# Patient Record
Sex: Male | Born: 1945 | Race: White | Hispanic: No | Marital: Married | Smoking: Never smoker
Health system: Southern US, Community
[De-identification: ages and names within clinical notes are randomized; demographics above are authoritative.]

## PROBLEM LIST (undated history)

## (undated) DIAGNOSIS — K219 Gastro-esophageal reflux disease without esophagitis: Secondary | ICD-10-CM

## (undated) DIAGNOSIS — I4891 Unspecified atrial fibrillation: Secondary | ICD-10-CM

## (undated) DIAGNOSIS — M545 Low back pain, unspecified: Secondary | ICD-10-CM

## (undated) HISTORY — PX: VASECTOMY: SHX75

## (undated) HISTORY — DX: Low back pain, unspecified: M54.50

## (undated) HISTORY — PX: ROTATOR CUFF REPAIR: SHX139

## (undated) HISTORY — PX: TONSILLECTOMY: SUR1361

## (undated) HISTORY — DX: Low back pain: M54.5

## (undated) HISTORY — PX: LUMBAR LAMINECTOMY: SHX95

## (undated) HISTORY — DX: Unspecified atrial fibrillation: I48.91

---

## 1979-06-14 HISTORY — PX: VASECTOMY: SHX75

## 1989-06-13 HISTORY — PX: LUMBAR LAMINECTOMY: SHX95

## 2001-03-06 ENCOUNTER — Ambulatory Visit (HOSPITAL_COMMUNITY): Admission: RE | Admit: 2001-03-06 | Discharge: 2001-03-06 | Payer: Self-pay | Admitting: Family Medicine

## 2001-03-13 ENCOUNTER — Encounter: Admission: RE | Admit: 2001-03-13 | Discharge: 2001-03-13 | Payer: Self-pay | Admitting: Sports Medicine

## 2001-04-03 ENCOUNTER — Encounter: Admission: RE | Admit: 2001-04-03 | Discharge: 2001-04-03 | Payer: Self-pay | Admitting: Sports Medicine

## 2003-12-24 ENCOUNTER — Ambulatory Visit (HOSPITAL_COMMUNITY): Admission: RE | Admit: 2003-12-24 | Discharge: 2003-12-24 | Payer: Self-pay | Admitting: Gastroenterology

## 2003-12-24 ENCOUNTER — Encounter (INDEPENDENT_AMBULATORY_CARE_PROVIDER_SITE_OTHER): Payer: Self-pay | Admitting: Specialist

## 2008-07-16 ENCOUNTER — Ambulatory Visit: Payer: Self-pay | Admitting: Sports Medicine

## 2008-07-16 DIAGNOSIS — S838X9A Sprain of other specified parts of unspecified knee, initial encounter: Secondary | ICD-10-CM

## 2008-07-16 DIAGNOSIS — S86819A Strain of other muscle(s) and tendon(s) at lower leg level, unspecified leg, initial encounter: Secondary | ICD-10-CM

## 2008-07-16 DIAGNOSIS — M79609 Pain in unspecified limb: Secondary | ICD-10-CM

## 2008-07-23 ENCOUNTER — Ambulatory Visit: Payer: Self-pay | Admitting: Sports Medicine

## 2008-08-07 ENCOUNTER — Ambulatory Visit: Payer: Self-pay | Admitting: Sports Medicine

## 2008-09-04 ENCOUNTER — Ambulatory Visit: Payer: Self-pay | Admitting: Sports Medicine

## 2010-10-29 NOTE — Op Note (Signed)
Andre Parker, Andre Parker                             ACCOUNT NO.:  [id number]   MEDICAL RECORD NO.:  [id number]                   PATIENT TYPE:  AMB   LOCATION:  ENDO                                 FACILITY:  [facility]   PHYSICIAN:  Bernette Redbird, M.D.                DATE OF BIRTH:  [birth year]   DATE OF PROCEDURE:  12/24/2003  DATE OF DISCHARGE:                                 OPERATIVE REPORT   PROCEDURE:  Colonoscopy with polypectomy and biopsies.   INDICATION:  Screening for colon cancer in an asymptomatic 65 year old with  a possible family history of colon polyps in his father.   FINDINGS:  Four small polyps removed.   PROCEDURE:  The nature, purpose and risks of the procedure have been  discussed with patient who provided written consent.  Sedation was fentanyl  62.5 mcg and Versed 6 mg IV without an clinical instability.  Digital exam  of the prostate was normal.  The Olympus adult adjustable tension video  colonoscope was advanced easily to the cecum and for a short distance into a  normal-appearing terminal ileum, whereupon pullback was initiated.  The  quality of the prep was excellent and it was felt that all areas were well  seen.   At the level of the ileocecal valve, contralateral, was a semi-pedunculated  bilobed sessile polyp measuring approximately 4 x 5 mm or so, removed by  cold snare technique and then the base was touched up with a little bit of  cold biopsies.   In the proximal ascending colon were two small sessile polyps removed by  cold biopsy technique, and at 17 cm in the proximal rectum was a diminutive  sessile polyp again removed by cold biopsy.   No large polyps, cancer, colitis, vascular malformations or diverticulosis  were noted.  Retroflexion of the rectum and reinspection of the rectum was  otherwise unremarkable.  The patient tolerated the procedure well and there  were no apparent complications.   IMPRESSION:  Colon polyps removed as described  above.   PLAN:  1. Await pathology results.  2. Probable colonoscopic followup in 3-5 years depending on the histology     and keeping in mind the possible family history of colon polyps in his     father.                                               Bernette Redbird, M.D.    RB/MEDQ  D:  12/24/2003  T:  12/24/2003  Job:  [id number]   cc:   Marjory Lies, M.D.  [address]  [place]  [place] [address]  Fax: [phone]

## 2011-04-26 ENCOUNTER — Other Ambulatory Visit: Payer: Self-pay | Admitting: Gastroenterology

## 2011-10-11 ENCOUNTER — Other Ambulatory Visit: Payer: Self-pay | Admitting: Family Medicine

## 2011-10-11 DIAGNOSIS — M949 Disorder of cartilage, unspecified: Secondary | ICD-10-CM

## 2011-10-11 DIAGNOSIS — M899 Disorder of bone, unspecified: Secondary | ICD-10-CM

## 2011-10-17 ENCOUNTER — Ambulatory Visit
Admission: RE | Admit: 2011-10-17 | Discharge: 2011-10-17 | Disposition: A | Discharge status: Home / Self Care | Payer: Medicare Other | Source: Ambulatory Visit | Attending: Family Medicine | Admitting: Family Medicine

## 2011-10-17 DIAGNOSIS — M899 Disorder of bone, unspecified: Secondary | ICD-10-CM

## 2011-10-17 DIAGNOSIS — M949 Disorder of cartilage, unspecified: Secondary | ICD-10-CM

## 2011-11-08 ENCOUNTER — Ambulatory Visit (INDEPENDENT_AMBULATORY_CARE_PROVIDER_SITE_OTHER): Payer: Medicare Other | Admitting: Sports Medicine

## 2011-11-08 VITALS — BP 110/60 | Ht 71.0 in | Wt 165.0 lb

## 2011-11-08 DIAGNOSIS — M2550 Pain in unspecified joint: Secondary | ICD-10-CM | POA: Insufficient documentation

## 2011-11-08 NOTE — Assessment & Plan Note (Signed)
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  Low back-he has gotten away from the [facility] flexion exercises that he did in the past and needs to return to doing these. For work he should also use some back support as I think this may reduce his risk of injury while lifting.  Right SI joint-this is extremely tight on the [facility] and we gave him hip rotation and some stretching exercises to try to loosen this. This may be secondary to his low back issues but is triggering the sharp pain he has down the right side.  He will continue to follow up with Dr. Doristine Counter. I did not add any new medications as I think his supplements are working fine he can take occasional ibuprofen. I'm happy to see him in followup if any issues become more severe.

## 2011-11-08 NOTE — Patient Instructions (Addendum)
For neck: Do easy range of motion Shake outs - 10-15 reps a few times per day   Standing hip rotations  Cross over stretches  Knee to chest stretch  Please follow up in 4 weeks if not improving  Thank you for seeing [place] today!

## 2011-11-08 NOTE — Progress Notes (Signed)
  Subjective:    Patient ID: Andre Parker, male    DOB: 03-24-1946, 66 y.o.   MRN: [id number]  HPI Patient I had seen years ago for running injuries Had been collegiate runner  Late April saw Dr Doristine Counter Burning into both thighs and sharp pain from rt SI joint to lateral side Left neck pain and spasm Checked iron levels which were fine, was supplemented with magnesium which has helped  1995 L5-S1 laminectomy with radicular sx to left leg  He complains of some generalized joint issues and some pain in his left knee as well as the problems with his right hip and SI joint and the left side of his neck   Review of Systems     Objective:   Physical Exam No acute distress Neck exam: Full flexion and extension 45 deg left rotation 30 deg rt rotation 15 deg rt bend 20 deg lt bend with some clicking Lt trapezius spasm No scapular dysfunction C5-T1 normal for strength testing  Full shoulder motion bilat Empty can negative bilat Mildly positive hawkins on rt Shoulder exam normal bilat otherwise  Tight HS bilat FABER tight on rt Straight leg raise neg bilat SI joints move freely  Hip ROM good bilat  Lt knee -5 deg full extension,140 deg of flexion Rt knee -3 deg full extension, 160 deg of flexion          Assessment & Plan:

## 2014-06-13 HISTORY — PX: ROTATOR CUFF REPAIR: SHX139

## 2014-07-17 ENCOUNTER — Other Ambulatory Visit (HOSPITAL_COMMUNITY): Payer: Self-pay | Admitting: Orthopedic Surgery

## 2014-07-17 ENCOUNTER — Ambulatory Visit (HOSPITAL_COMMUNITY)
Admission: RE | Admit: 2014-07-17 | Discharge: 2014-07-17 | Disposition: A | Discharge status: Home / Self Care | Payer: Medicare Other | Source: Ambulatory Visit | Attending: Orthopedic Surgery | Admitting: Orthopedic Surgery

## 2014-07-17 DIAGNOSIS — Z1389 Encounter for screening for other disorder: Secondary | ICD-10-CM | POA: Insufficient documentation

## 2014-07-17 DIAGNOSIS — R930 Abnormal findings on diagnostic imaging of skull and head, not elsewhere classified: Secondary | ICD-10-CM | POA: Insufficient documentation

## 2014-07-17 DIAGNOSIS — M25511 Pain in right shoulder: Secondary | ICD-10-CM

## 2015-11-24 DIAGNOSIS — M545 Low back pain, unspecified: Secondary | ICD-10-CM | POA: Insufficient documentation

## 2015-11-24 DIAGNOSIS — S32010A Wedge compression fracture of first lumbar vertebra, initial encounter for closed fracture: Secondary | ICD-10-CM | POA: Insufficient documentation

## 2015-11-24 DIAGNOSIS — M1812 Unilateral primary osteoarthritis of first carpometacarpal joint, left hand: Secondary | ICD-10-CM | POA: Insufficient documentation

## 2015-11-25 DIAGNOSIS — M79672 Pain in left foot: Secondary | ICD-10-CM | POA: Insufficient documentation

## 2015-11-25 DIAGNOSIS — R0781 Pleurodynia: Secondary | ICD-10-CM | POA: Insufficient documentation

## 2016-01-25 DIAGNOSIS — Z23 Encounter for immunization: Secondary | ICD-10-CM | POA: Insufficient documentation

## 2016-06-13 DIAGNOSIS — I499 Cardiac arrhythmia, unspecified: Secondary | ICD-10-CM

## 2016-06-13 HISTORY — DX: Cardiac arrhythmia, unspecified: I49.9

## 2016-09-05 ENCOUNTER — Telehealth: Payer: Self-pay | Admitting: Cardiology

## 2016-09-05 NOTE — Telephone Encounter (Signed)
[facility] appointment on 09/09/16 with Dr Antoine PocheHochrein.  Records put with Dr Hochrein's schedule for 09/09/16. lp

## 2016-09-08 NOTE — Progress Notes (Signed)
Cardiology Office Note   Date:  09/11/2016   ID:  Andre Parker, DOB February 04, 1946, MRN [id number]  PCP:  Delorse LekBURNETT,BRENT A, MD  Cardiologist:   Rollene RotundaJames Camauri Craton, MD  Referring:  Delorse LekBURNETT,BRENT A, MD  Chief Complaint  Patient presents with  . Atrial Fibrillation      History of Present Illness: Andre Parker is a 71 y.o. male who presents for evaluation of atrial fib.  He has no past cardiac history. He was noted incidentally to be in fibrillation earlier colonoscopy. He's also noted to have a right bundle branch block and I don't see an old EKG for comparison. He said he had a treadmill test years ago but otherwise has not had any cardiovascular testing. He was a college distance runner.  The patient denies any new symptoms such as chest discomfort, neck or arm discomfort. There has been no new shortness of breath, PND or orthopnea. There have been no reported palpitations, presyncope or syncope.  He still gets on his treadmill and does some fast walking. With this he has no cardiovascular symptoms. He would not know that it was in fibrillation.  Past Medical History:  Diagnosis Date  . Atrial fibrillation (HCC)   . Low back pain     Past Surgical History:  Procedure Laterality Date  . LUMBAR LAMINECTOMY    . ROTATOR CUFF REPAIR Right   . TONSILLECTOMY    . VASECTOMY       Current Outpatient Prescriptions  Medication Sig Dispense Refill  . aspirin 325 MG tablet Take 162 mg by mouth daily.    . calcium citrate (CALCITRATE - DOSED IN MG ELEMENTAL CALCIUM) 950 MG tablet Take 1 tablet by mouth 3 (three) times daily.     . Cholecalciferol (VITAMIN D-3 PO) Take 1,000 Units by mouth daily.     . ferrous sulfate 325 (65 FE) MG tablet Take 325 mg by mouth daily with breakfast.    . ibuprofen (ADVIL,MOTRIN) 200 MG tablet Take 200 mg by mouth every 6 (six) hours as needed.    . magnesium 30 MG tablet Take 800 mg by mouth daily.    . Multiple Vitamin (MULTIVITAMIN) tablet Take 1 tablet by mouth  daily.     No current facility-administered medications for this visit.     Allergies:   Patient has no known allergies.    Social History:  The patient  reports that he has never smoked. He has never used smokeless tobacco.   Family History:  The patient's family history includes Heart disease in his father; Myelodysplastic syndrome in his sister; Prostate cancer in his brother and father.    ROS:  Please see the history of present illness.   Otherwise, review of systems are positive for none.   All other systems are reviewed and negative.    PHYSICAL EXAM: VS:  BP 117/76   Pulse 93   Ht 5\' 11"  (1.803 m)   Wt 174 lb (78.9 kg)   BMI 24.27 kg/m  , BMI Body mass index is 24.27 kg/m. GENERAL:  Well appearing and fit HEENT:  Pupils equal round and reactive, fundi not visualized, oral mucosa unremarkable NECK:  No jugular venous distention, waveform within normal limits, carotid upstroke brisk and symmetric, no bruits, no thyromegaly LYMPHATICS:  No cervical, inguinal adenopathy LUNGS:  Clear to auscultation bilaterally BACK:  No CVA tenderness CHEST:  Unremarkable HEART:  PMI not displaced or sustained,S1 and S2 within normal limits, no S3, no clicks, no rubs, no  murmurs, very quiet heart sounds, irregular ABD:  Flat, positive bowel sounds normal in frequency in pitch, no bruits, no rebound, no guarding, no midline pulsatile mass, no hepatomegaly, no splenomegaly EXT:  2 plus pulses throughout, no edema, no cyanosis no clubbing SKIN:  No rashes no nodules NEURO:  Cranial nerves II through XII grossly intact, motor grossly intact throughout PSYCH:  Cognitively intact, oriented to person place and time    EKG:  EKG is ordered today. The ekg ordered today demonstrates atrial fibrillation, rate 93, right axis deviation, right bundle branch block   Recent Labs: No results found for requested labs within last 8760 hours.    Lipid Panel No results found for: CHOL, TRIG, HDL,  CHOLHDL, VLDL, LDLCALC, LDLDIRECT    Wt Readings from Last 3 Encounters:  09/09/16 174 lb (78.9 kg)  11/08/11 165 lb (74.8 kg)  09/04/08 167 lb (75.8 kg)      Other studies Reviewed: Additional studies/ records that were reviewed today include: Office records. Review of the above records demonstrates:  Please see elsewhere in the note.     ASSESSMENT AND PLAN:  ATRIAL FIB:  The patient does not feel this. I don't think the benefit to trying to convert to sinus rhythm at this point. [name] has a CHA2DS2 - VASc score of 1 with a risk of stroke of 1.3%.  I am going to check an echocardiogram. I'm going to apply 24-hour Holter monitor to make sure he is rate control.  He will follow up after these results.   RBBB:  I have no old EKGs for comparison.  I will evaluate as above.  No change in therapy at this point.    Current medicines are reviewed at length with the patient today.  The patient does not have concerns regarding medicines.  The following changes have been made:  no change  Labs/ tests ordered today include:   Orders Placed This Encounter  Procedures  . Holter monitor - 24 hour  . EKG 12-Lead  . ECHOCARDIOGRAM COMPLETE     Disposition:   FU with me after the above studies.     Signed, Rollene Rotunda, MD  09/11/2016 10:11 AM    [facility]

## 2016-09-09 ENCOUNTER — Ambulatory Visit (INDEPENDENT_AMBULATORY_CARE_PROVIDER_SITE_OTHER): Payer: Medicare Other | Admitting: Cardiology

## 2016-09-09 ENCOUNTER — Encounter: Payer: Self-pay | Admitting: Cardiology

## 2016-09-09 VITALS — BP 117/76 | HR 93 | Ht 71.0 in | Wt 174.0 lb

## 2016-09-09 DIAGNOSIS — I4510 Unspecified right bundle-branch block: Secondary | ICD-10-CM | POA: Diagnosis not present

## 2016-09-09 DIAGNOSIS — I481 Persistent atrial fibrillation: Secondary | ICD-10-CM | POA: Diagnosis not present

## 2016-09-09 DIAGNOSIS — I4819 Other persistent atrial fibrillation: Secondary | ICD-10-CM

## 2016-09-09 NOTE — Patient Instructions (Signed)
Medication Instructions:  Continue current medications  Labwork: None Ordered  Testing/Procedures: Your physician has requested that you have an echocardiogram. Echocardiography is a painless test that uses sound waves to create images of your heart. It provides your doctor with information about the size and shape of your heart and how well your heart's chambers and valves are working. This procedure takes approximately one hour. There are no restrictions for this procedure.  Your physician has recommended that you wear a 24 hours holter monitor. Holter monitors are medical devices that record the heart's electrical activity. Doctors most often use these monitors to diagnose arrhythmias. Arrhythmias are problems with the speed or rhythm of the heartbeat. The monitor is a small, portable device. You can wear one while you do your normal daily activities. This is usually used to diagnose what is causing palpitations/syncope (passing out).   Follow-Up: Your physician recommends that you schedule a follow-up appointment in: After Test   Any Other Special Instructions Will Be Listed Below (If Applicable).   If you need a refill on your cardiac medications before your next appointment, please call your pharmacy.

## 2016-09-11 ENCOUNTER — Encounter: Payer: Self-pay | Admitting: Cardiology

## 2016-09-11 DIAGNOSIS — I4510 Unspecified right bundle-branch block: Secondary | ICD-10-CM | POA: Insufficient documentation

## 2016-09-11 DIAGNOSIS — I4819 Other persistent atrial fibrillation: Secondary | ICD-10-CM | POA: Insufficient documentation

## 2016-09-26 ENCOUNTER — Ambulatory Visit (HOSPITAL_COMMUNITY): Payer: Medicare Other | Attending: Cardiology

## 2016-09-26 ENCOUNTER — Ambulatory Visit (INDEPENDENT_AMBULATORY_CARE_PROVIDER_SITE_OTHER): Payer: Medicare Other

## 2016-09-26 ENCOUNTER — Other Ambulatory Visit: Payer: Self-pay

## 2016-09-26 DIAGNOSIS — I517 Cardiomegaly: Secondary | ICD-10-CM | POA: Insufficient documentation

## 2016-09-26 DIAGNOSIS — I361 Nonrheumatic tricuspid (valve) insufficiency: Secondary | ICD-10-CM | POA: Insufficient documentation

## 2016-09-26 DIAGNOSIS — I481 Persistent atrial fibrillation: Secondary | ICD-10-CM

## 2016-09-26 DIAGNOSIS — I4819 Other persistent atrial fibrillation: Secondary | ICD-10-CM

## 2016-09-26 DIAGNOSIS — I358 Other nonrheumatic aortic valve disorders: Secondary | ICD-10-CM | POA: Insufficient documentation

## 2016-09-26 DIAGNOSIS — I4891 Unspecified atrial fibrillation: Secondary | ICD-10-CM | POA: Diagnosis present

## 2016-10-09 NOTE — Progress Notes (Signed)
Cardiology Office Note   Date:  10/10/2016   ID:  Andre Parker, DOB Sep 18, 1945, MRN [id number]  PCP:  Delorse Lek, MD  Cardiologist:   Rollene Rotunda, MD  Referring:  Delorse Lek, MD  Chief Complaint  Patient presents with  . Atrial Fibrillation      History of Present Illness: Andre Parker is a 71 y.o. male who presents for evaluation of atrial fib.  At the last appt last month I ordered a Holter which demonstrated persistent atrial fib with good rate control.  His echo was normal.  He returns for follow up.  He is doing well.  The patient denies any new symptoms such as chest discomfort, neck or arm discomfort. There has been no new shortness of breath, PND or orthopnea. There have been no reported palpitations, presyncope or syncope.  Past Medical History:  Diagnosis Date  . Atrial fibrillation (HCC)   . Low back pain     Past Surgical History:  Procedure Laterality Date  . LUMBAR LAMINECTOMY    . ROTATOR CUFF REPAIR Right   . TONSILLECTOMY    . VASECTOMY       Current Outpatient Prescriptions  Medication Sig Dispense Refill  . aspirin 325 MG tablet Take 162 mg by mouth daily.    . Calcium Citrate (CAL-CITRATE PO) Take 1,200 mg by mouth daily.  in the morning,  afternoon, 200 nightly    . chlorpheniramine (CHLOR-TRIMETON) 4 MG tablet Take 4 mg by mouth as needed for allergies.    . Cholecalciferol (VITAMIN D3) 1000 units CAPS Take 3 capsules by mouth daily.    . ferrous sulfate 325 (65 FE) MG tablet Take 325 mg by mouth daily with breakfast.    . ibuprofen (ADVIL,MOTRIN) 200 MG tablet Take 200 mg by mouth every 6 (six) hours as needed.    . Magnesium 400 MG TABS Take 400 mg by mouth daily.    . Multiple Vitamin (MULTIVITAMIN) tablet Take 1 tablet by mouth daily.     No current facility-administered medications for this visit.     Allergies:   Patient has no known allergies.    ROS:  Please see the history of present illness.   Otherwise,  review of systems are positive for none.   All other systems are reviewed and negative.    PHYSICAL EXAM: VS:  BP 100/64 (BP Location: Right Arm, Patient Position: Sitting, Cuff Size: Normal)   Pulse 76   Ht 5' 10.5" (1.791 m)   Wt 175 lb 9.6 oz (79.7 kg)   BMI 24.84 kg/m  , BMI Body mass index is 24.84 kg/m. GENERAL:  Well appearing and in no distress.   NECK:  No jugular venous distention, waveform within normal limits, carotid upstroke brisk and symmetric, no bruits, no thyromegaly LUNGS:  Clear to auscultation bilaterally BACK:  No CVA tenderness HEART:  PMI not displaced or sustained,S1 and S2 within normal limits, no S3, no clicks, no rubs, no murmurs,  irregular ABD:  Flat, positive bowel sounds normal in frequency in pitch, no bruits, no rebound, no guarding, no midline pulsatile mass, no hepatomegaly, no splenomegaly EXT:  2 plus pulses throughout, no edema, no cyanosis no clubbing   EKG:  EKG is not ordered today.   Recent Labs: No results found for requested labs within last 8760 hours.    Lipid Panel No results found for: CHOL, TRIG, HDL, CHOLHDL, VLDL, LDLCALC, LDLDIRECT    Wt Readings from Last 3 Encounters:  10/10/16  175 lb 9.6 oz (79.7 kg)  09/09/16 174 lb (78.9 kg)  11/08/11 165 lb (74.8 kg)      Other studies Reviewed: Additional studies/ records that were reviewed today include:  None Review of the above records demonstrates:     ASSESSMENT AND PLAN:  ATRIAL FIB:  [name] has a CHA2DS2 - VASc score of 1 with a risk of stroke of 1.3%.  We had another discussion about the etiology and therapy.  He does not need anticoagulation at this point.  He has good rate control on Holter.  No change in therapy is planned.   RBBB:  I have no old EKGs for comparison. He will get me his latest tracing.  No change in therapy at this point.    Current medicines are reviewed at length with the patient today.  The patient does not have concerns regarding  medicines.  The following changes have been made:  None  Labs/ tests ordered today include:  None No orders of the defined types were placed in this encounter.    Disposition:   FU with me in six months.     Signed, Rollene Rotunda, MD  10/10/2016 9:15 PM    [facility]

## 2016-10-10 ENCOUNTER — Encounter: Payer: Self-pay | Admitting: Cardiology

## 2016-10-10 ENCOUNTER — Ambulatory Visit (INDEPENDENT_AMBULATORY_CARE_PROVIDER_SITE_OTHER): Payer: Medicare Other | Admitting: Cardiology

## 2016-10-10 VITALS — BP 100/64 | HR 76 | Ht 70.5 in | Wt 175.6 lb

## 2016-10-10 DIAGNOSIS — I4510 Unspecified right bundle-branch block: Secondary | ICD-10-CM | POA: Diagnosis not present

## 2016-10-10 DIAGNOSIS — I482 Chronic atrial fibrillation: Secondary | ICD-10-CM | POA: Diagnosis not present

## 2016-10-10 DIAGNOSIS — I4821 Permanent atrial fibrillation: Secondary | ICD-10-CM

## 2016-10-10 NOTE — Patient Instructions (Signed)
Medication Instructions: No changes  Follow-Up: Your physician wants you to follow-up in: 6 months with Dr. Hochrein. You will receive a reminder letter in the mail two months in advance. If you don't receive a letter, please call our office to schedule the follow-up appointment.   If you need a refill on your cardiac medications before your next appointment, please call your pharmacy.   

## 2016-10-11 ENCOUNTER — Ambulatory Visit: Payer: Medicare Other | Admitting: Cardiology

## 2017-02-17 ENCOUNTER — Telehealth: Payer: Self-pay | Admitting: Cardiology

## 2017-02-17 NOTE — Telephone Encounter (Signed)
S/w DOD ok to give blood because he does not take anticoagulants.  Pt notified. expresses thanks

## 2017-02-17 NOTE — Telephone Encounter (Signed)
New message      Pt was recently diagnosed with AFIB.  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  He want to know if it will "throw" back in afib?  He also want to know if he can continue giving "doubles"----plasma and platelets (something like that)  Ok to send  msg in [employer]

## 2017-04-06 NOTE — Progress Notes (Signed)
Cardiology Office Note   Date:  04/07/2017   ID:  Andre Parker, DOB [birth year], MRN [id number]  PCP:  Andre Parker, Andre J, PA-C  Cardiologist:   Andre RotundaJames Laelani Vasko, MD    Chief Complaint  Patient presents with  . Atrial Fibrillation      History of Present Illness: Andre Parker is a 71 y.o. male who presents for evaluation of atrial fib.  He has had a Holter with persistent atrial fib with good rate control.  His echo was normal.  Since I last saw him he has done well.  The patient denies any new symptoms such as chest discomfort, neck or arm discomfort. There has been no new shortness of breath, PND or orthopnea. There have been no reported palpitations, presyncope or syncope.  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    He has about 20 trees to cut down in his yard.    Past Medical History:  Diagnosis Date  . Atrial fibrillation (HCC)   . Low back pain     Past Surgical History:  Procedure Laterality Date  . LUMBAR LAMINECTOMY    . ROTATOR CUFF REPAIR Right   . TONSILLECTOMY    . VASECTOMY       Current Outpatient Prescriptions  Medication Sig Dispense Refill  . Ascorbic Acid (VITAMIN C PO) Take 500 mg by mouth 2 (two) times daily.     Marland Kitchen. aspirin 325 MG tablet Take 162 mg by mouth daily.    . Calcium Citrate (CAL-CITRATE PO) Take 250 mg by mouth 3 (three) times daily.     . chlorpheniramine (CHLOR-TRIMETON) 4 MG tablet Take 4 mg by mouth as needed for allergies.    . Cholecalciferol (VITAMIN D3) 1000 units CAPS Take 1 capsule by mouth daily.     . ferrous sulfate 325 (65 FE) MG tablet Take 325 mg by mouth daily with breakfast.    . ibuprofen (ADVIL,MOTRIN) 200 MG tablet Take 200 mg by mouth every 6 (six) hours as needed.    . Magnesium 500 MG CAPS Take 500 mg by mouth daily.    . Multiple Vitamin (MULTIVITAMIN) tablet Take 1 tablet by mouth daily.     No current facility-administered medications for this visit.      Allergies:   Patient has no known allergies.    ROS:  Please see the history of present illness.   Otherwise, review of systems are positive for none.   All other systems are reviewed and negative.    PHYSICAL EXAM: VS:  BP 116/80   Pulse 64   Ht 5' 10.5" (1.791 m)   Wt 171 lb (77.6 kg)   BMI 24.19 kg/m  , BMI Body mass index is 24.19 kg/m.  GENERAL:  Well appearing NECK:  No jugular venous distention, waveform within normal limits, carotid upstroke brisk and symmetric, no bruits, no thyromegaly LUNGS:  Clear to auscultation bilaterally CHEST:  Unremarkable HEART:  PMI not displaced or sustained,S1 and S2 within normal limits, no S3, no S4, no clicks, no rubs, no murmurs ABD:  Flat, positive bowel sounds normal in frequency in pitch, no bruits, no rebound, no guarding, no midline pulsatile mass, no hepatomegaly, no splenomegaly EXT:  2 plus pulses throughout, no edema, no cyanosis no clubbing   EKG:  EKG is not ordered today.   Recent Labs: No results found for requested labs within last 8760 hours.    Lipid Panel No results  found for: CHOL, TRIG, HDL, CHOLHDL, VLDL, LDLCALC, LDLDIRECT    Wt Readings from Last 3 Encounters:  04/07/17 171 lb (77.6 kg)  10/10/16 175 lb 9.6 oz (79.7 kg)  09/09/16 174 lb (78.9 kg)      Other studies Reviewed: Additional studies/ records that were reviewed today include:  None Review of the above records demonstrates:      ASSESSMENT AND PLAN:  ATRIAL FIB:  [name] has a CHA2DS2 - VASc score of 1 with a risk of stroke of 1.3%.  He does not feel this rhythm.  He has no symptoms.  No change in therapy is planned.    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  No further evaluation is indicated.    Current medicines are reviewed at length with the patient today.  The patient does not have concerns regarding medicines.  The following changes have been made:  None  Labs/ tests ordered today include:   No orders of the defined types were  placed in this encounter.    Disposition:   FU with me in one year or so.     Signed, Andre Rotunda, MD  04/07/2017 12:01 PM    [facility]

## 2017-04-07 ENCOUNTER — Ambulatory Visit (INDEPENDENT_AMBULATORY_CARE_PROVIDER_SITE_OTHER): Payer: Medicare Other | Admitting: Cardiology

## 2017-04-07 ENCOUNTER — Encounter: Payer: Self-pay | Admitting: Cardiology

## 2017-04-07 VITALS — BP 116/80 | HR 64 | Ht 70.5 in | Wt 171.0 lb

## 2017-04-07 DIAGNOSIS — I4510 Unspecified right bundle-branch block: Secondary | ICD-10-CM | POA: Diagnosis not present

## 2017-04-07 DIAGNOSIS — I482 Chronic atrial fibrillation: Secondary | ICD-10-CM

## 2017-04-07 DIAGNOSIS — I4821 Permanent atrial fibrillation: Secondary | ICD-10-CM

## 2017-04-07 NOTE — Patient Instructions (Signed)
Medication Instructions:  Continue current medications  If you need a refill on your cardiac medications before your next appointment, please call your pharmacy.  Labwork: None Ordered   Testing/Procedures: None Ordered  Follow-Up: Your physician wants you to follow-up in: 1 year. You should receive a reminder letter in the mail two months in advance. If you do not receive a letter, please call our office [phone].    Thank you for choosing [facility]!!

## 2018-04-10 ENCOUNTER — Ambulatory Visit: Payer: Medicare Other | Admitting: Cardiology

## 2018-04-10 NOTE — Progress Notes (Signed)
Cardiology Office Note   Date:  04/18/2018   ID:  Tait Balistreri, DOB [birth year], MRN [id number]  PCP:  Roger Kill, PA-C  Cardiologist:   Rollene Rotunda, MD    Chief Complaint  Patient presents with  . Atrial Fibrillation      History of Present Illness: Andre Parker is a 72 y.o. male who presents for evaluation of atrial fib.  He has had a Holter with persistent atrial fib with good rate control.  His echo was normal.  Since I last saw him he denies any new cardiovascular symptoms other than maybe some mild dyspnea walking a hill.  However, he is very active.  He cuts wood takes gravel.  With all of this he denies any cardiovascular symptoms.  He would not notice that he was in atrial fibrillation.  He denies any palpitations, presyncope or syncope.  He has no weight gain or edema.  He has no PND or orthopnea.   Past Medical History:  Diagnosis Date  . Atrial fibrillation (HCC)   . Low back pain     Past Surgical History:  Procedure Laterality Date  . LUMBAR LAMINECTOMY    . ROTATOR CUFF REPAIR Right   . TONSILLECTOMY    . VASECTOMY       Current Outpatient Medications  Medication Sig Dispense Refill  . Ascorbic Acid (VITAMIN C PO) Take 500 mg by mouth 2 (two) times daily.     . Calcium Citrate (CAL-CITRATE PO) Take 250 mg by mouth 3 (three) times daily.     . chlorpheniramine (CHLOR-TRIMETON) 4 MG tablet Take 4 mg by mouth as needed for allergies.    . Cholecalciferol (VITAMIN D3) 1000 units CAPS Take 1 capsule by mouth daily.     . ferrous sulfate 325 (65 FE) MG tablet Take 325 mg by mouth daily with breakfast.    . ibuprofen (ADVIL,MOTRIN) 200 MG tablet Take 200 mg by mouth every 6 (six) hours as needed.    . Magnesium 250 MG TABS Take 1 tablet by mouth daily.    . Multiple Vitamin (MULTIVITAMIN) tablet Take 1 tablet by mouth daily.    Marland Kitchen apixaban (ELIQUIS) 5 MG TABS tablet Take 1 tablet (5 mg total) by mouth 2 (two) times daily. 60 tablet 11   No current  facility-administered medications for this visit.     Allergies:   Patient has no known allergies.    ROS:  Please see the history of present illness.   Otherwise, review of systems are positive for none.   All other systems are reviewed and negative.    PHYSICAL EXAM: VS:  BP 122/74   Pulse 82   Ht 5' 10.5" (1.791 m)   Wt 173 lb (78.5 kg)   BMI 24.47 kg/m  , BMI Body mass index is 24.47 kg/m.  GENERAL:  Well appearing NECK:  No jugular venous distention, waveform within normal limits, carotid upstroke brisk and symmetric, no bruits, no thyromegaly LUNGS:  Clear to auscultation bilaterally CHEST:  Unremarkable HEART:  PMI not displaced or sustained,S1 and S2 within normal limits, no S3, no clicks, no rubs, no murmurs, irregular ABD:  Flat, positive bowel sounds normal in frequency in pitch, no bruits, no rebound, no guarding, no midline pulsatile mass, no hepatomegaly, no splenomegaly EXT:  2 plus pulses throughout, no edema, no cyanosis no clubbing   EKG:  EKG is ordered today. Atrial fibrillation, rate 82, axis rightward, right bundle branch block, no acute ST-T wave  changes.  Recent Labs: No results found for requested labs within last 8760 hours.    Lipid Panel No results found for: CHOL, TRIG, HDL, CHOLHDL, VLDL, LDLCALC, LDLDIRECT    Wt Readings from Last 3 Encounters:  04/18/18 173 lb (78.5 kg)  04/07/17 171 lb (77.6 kg)  10/10/16 175 lb 9.6 oz (79.7 kg)      Other studies Reviewed: Additional studies/ records that were reviewed today include: Labs Review of the above records demonstrates:   See below   ASSESSMENT AND PLAN:   ATRIAL FIB:  Andre Parker has a CHA2DS2 - VASc score of 1 with a risk of 1.5%.  However, he is starting to get closer to the score of two at which point I would be recommending unequivocally blood thinner.  We had a long discussion about the risks benefits of this logic.  Patient activation is important.  He is taking an aspirin and  we discussed that there is a risk of bleeding with this and no real data to support embolic stroke prevention.  We discussed what is involving low risk of Eliquis.  Given this with extensive education he chooses to begin the Eliquis now and I agree with this.  Again he understands risk benefits and bleeding risk versus embolic stroke prevention.  He will stop his aspirin.  I do need to get some baseline labs to include CBC and basic metabolic profile.  He has no active bleeding issues or contraindications I would like to have him come back in a few months to make sure he is tolerating the medicine and to check his labs.  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    No further evaluation is indicated.    Current medicines are reviewed at length with the patient today.  The patient does not have concerns regarding medicines.  The following changes have been made:  None   Labs/ tests ordered today include:   Orders Placed This Encounter  Procedures  . CBC  . Basic Metabolic Panel (BMET)  . EKG 12-Lead     Disposition:   FU with me in 4 months.    Signed, Rollene Rotunda, MD  04/18/2018 10:10 AM    [facility]

## 2018-04-18 ENCOUNTER — Encounter: Payer: Self-pay | Admitting: Cardiology

## 2018-04-18 ENCOUNTER — Ambulatory Visit (INDEPENDENT_AMBULATORY_CARE_PROVIDER_SITE_OTHER): Payer: Medicare Other | Admitting: Cardiology

## 2018-04-18 VITALS — BP 122/74 | HR 82 | Ht 70.5 in | Wt 173.0 lb

## 2018-04-18 DIAGNOSIS — Z79899 Other long term (current) drug therapy: Secondary | ICD-10-CM | POA: Insufficient documentation

## 2018-04-18 DIAGNOSIS — I4821 Permanent atrial fibrillation: Secondary | ICD-10-CM | POA: Diagnosis not present

## 2018-04-18 LAB — BASIC METABOLIC PANEL
BUN/Creatinine Ratio: 24 (ref 10–24)
BUN: 20 mg/dL (ref 8–27)
CO2: 26 mmol/L (ref 20–29)
Calcium: 9.1 mg/dL (ref 8.6–10.2)
Chloride: 99 mmol/L (ref 96–106)
Creatinine, Ser: 0.85 mg/dL (ref 0.76–1.27)
GFR calc Af Amer: 100 mL/min/{1.73_m2} (ref >59)
GFR calc non Af Amer: 86 mL/min/{1.73_m2} (ref >59)
GLUCOSE: 80 mg/dL (ref 65–99)
POTASSIUM: 4.4 mmol/L (ref 3.5–5.2)
Sodium: 137 mmol/L (ref 134–144)

## 2018-04-18 LAB — CBC
Hematocrit: 46.4 % (ref 37.5–51.0)
Hemoglobin: 16 g/dL (ref 13.0–17.7)
MCH: 34.3 pg — AB (ref 26.6–33.0)
MCHC: 34.5 g/dL (ref 31.5–35.7)
MCV: 100 fL — ABNORMAL HIGH (ref 79–97)
PLATELETS: 178 10*3/uL (ref 150–450)
RBC: 4.66 x10E6/uL (ref 4.14–5.80)
RDW: 12.2 % — ABNORMAL LOW (ref 12.3–15.4)
WBC: 4.4 10*3/uL (ref 3.4–10.8)

## 2018-04-18 MED ORDER — APIXABAN 5 MG PO TABS: 5.0000 mg | ORAL_TABLET | Freq: Two times a day (BID) | ORAL | 11 refills | Status: DC

## 2018-04-18 NOTE — Patient Instructions (Signed)
Medication Instructions:  STOP- Aspirin  START- Eliquis 5 mg twice a day  If you need a refill on your cardiac medications before your next appointment, please call your pharmacy.  Labwork: CBC and BMP Today HERE IN OUR OFFICE AT [facility]  Take the provided lab slips with you to the lab for your blood draw.   You will NOT need to fast   If you have labs (blood work) drawn today and your tests are completely normal, you will receive your results only by: Marland Kitchen MyChart Message (if you have MyChart) OR . A paper copy in the mail If you have any lab test that is abnormal or we need to change your treatment, we will call you to review the results.  Testing/Procedures: None Ordered   Follow-Up: . You will need a follow up appointment in 3 Month with Dr Antoine Poche.      At [facility], you and your health needs are our priority.  As part of our continuing mission to provide you with exceptional heart care, we have created designated Provider Care Teams.  These Care Teams include your primary Cardiologist (physician) and Advanced Practice Providers (APPs -  Physician Assistants and Nurse Practitioners) who all work together to provide you with the care you need, when you need it.   Thank you for choosing [facility] at [facility]!!

## 2018-04-24 DIAGNOSIS — M25561 Pain in right knee: Secondary | ICD-10-CM | POA: Insufficient documentation

## 2018-07-02 ENCOUNTER — Telehealth: Payer: Self-pay | Admitting: Cardiology

## 2018-07-02 NOTE — Telephone Encounter (Signed)
Noted . No further action needed .

## 2018-07-02 NOTE — Telephone Encounter (Signed)
Routed to pharmD 

## 2018-07-02 NOTE — Telephone Encounter (Signed)
NO.  Patients on anticoagulation are not able to donate blood.

## 2018-07-02 NOTE — Telephone Encounter (Signed)
Follow up    Patient is calling to advise that he did get the answer to his question from the [employer]. A call back is not needed.

## 2018-07-02 NOTE — Telephone Encounter (Signed)
New Message:     Pt is on Eliquis, he wants to know if he can donate blood?

## 2018-07-22 NOTE — Progress Notes (Signed)
Cardiology Office Note   Date:  07/23/2018   ID:  Andre Parker, Andre Parker December 14, 1945, MRN [id number]  PCP:  Roger Kill, PA-C  Cardiologist:   Rollene Rotunda, MD    Chief Complaint  Patient presents with  . Atrial Fibrillation      History of Present Illness: Andre Parker is a 73 y.o. male who presents for evaluation of atrial fib.  He has had a Holter with persistent atrial fib with good rate control.  His echo was normal.  Since I last saw him he is done well. The patient denies any new symptoms such as chest discomfort, neck or arm discomfort. There has been no new shortness of breath, PND or orthopnea. There have been no reported palpitations, presyncope or syncope.      Past Medical History:  Diagnosis Date  . Atrial fibrillation (HCC)   . Low back pain     Past Surgical History:  Procedure Laterality Date  . LUMBAR LAMINECTOMY    . ROTATOR CUFF REPAIR Right   . TONSILLECTOMY    . VASECTOMY       Current Outpatient Medications  Medication Sig Dispense Refill  . apixaban (ELIQUIS) 5 MG TABS tablet Take 1 tablet (5 mg total) by mouth 2 (two) times daily. 60 tablet 11  . Ascorbic Acid (VITAMIN C PO) Take 500 mg by mouth 2 (two) times daily.     . Calcium Citrate (CAL-CITRATE PO) Take 250 mg by mouth 3 (three) times daily.     . chlorpheniramine (CHLOR-TRIMETON) 4 MG tablet Take 4 mg by mouth as needed for allergies.    . Cholecalciferol (VITAMIN D3) 1000 units CAPS Take 1 capsule by mouth daily.     . ferrous sulfate 325 (65 FE) MG tablet Take 325 mg by mouth daily with breakfast.    . ibuprofen (ADVIL,MOTRIN) 200 MG tablet Take 200 mg by mouth every 6 (six) hours as needed.    . Magnesium 250 MG TABS Take 1 tablet by mouth daily.    . Multiple Vitamin (MULTIVITAMIN) tablet Take 1 tablet by mouth daily.     No current facility-administered medications for this visit.     Allergies:   Patient has no known allergies.    ROS:  Please see the history of  present illness.   Otherwise, review of systems are positive for joint stiffness, abdominal bloating, 5 pound weight gain.   All other systems are reviewed and negative.    PHYSICAL EXAM: VS:  BP 112/84   Pulse 64   Ht 5' 10.5" (1.791 m)   Wt 179 lb 3.2 oz (81.3 kg)   SpO2 94%   BMI 25.35 kg/m  , BMI Body mass index is 25.35 kg/m.  GENERAL:  Well appearing NECK:  No jugular venous distention, waveform within normal limits, carotid upstroke brisk and symmetric, no bruits, no thyromegaly LUNGS:  Clear to auscultation bilaterally CHEST:  Unremarkable HEART:  PMI not displaced or sustained,S1 and S2 within normal limits, no S3,  no clicks, no rubs, no murmurs, irregular ABD:  Flat, positive bowel sounds normal in frequency in pitch, no bruits, no rebound, no guarding, no midline pulsatile mass, no hepatomegaly, no splenomegaly EXT:  2 plus pulses throughout, no edema, no cyanosis no clubbing   EKG:  EKG is not ordered today.   Recent Labs: 04/18/2018: BUN 20; Creatinine, Ser 0.85; Hemoglobin 16.0; Platelets 178; Potassium 4.4; Sodium 137    Lipid Panel No results found for: CHOL, TRIG,  HDL, CHOLHDL, VLDL, LDLCALC, LDLDIRECT    Wt Readings from Last 3 Encounters:  07/23/18 179 lb 3.2 oz (81.3 kg)  04/18/18 173 lb (78.5 kg)  04/07/17 171 lb (77.6 kg)      Other studies Reviewed: Additional studies/ records that were reviewed today include: Labs Review of the above records demonstrates:   See below   ASSESSMENT AND PLAN:   ATRIAL FIB:  Andre Parker has a CHA2DS2 - VASc score of 1 with a risk of 1.5%.  He chose to take Eliquis.  He has had blood work recently to include a normal hemoglobin.  He is on the appropriate dose.  He has no bleeding diathesis.  No change in therapy.    Current medicines are reviewed at length with the patient today.  The patient does not have concerns regarding medicines.  The following changes have been made:  None  Labs/ tests ordered today  include:  None No orders of the defined types were placed in this encounter.    Disposition:   FU with me in 12 months.    Signed, Rollene Rotunda, MD  07/23/2018 10:11 AM    [facility]

## 2018-07-23 ENCOUNTER — Ambulatory Visit (INDEPENDENT_AMBULATORY_CARE_PROVIDER_SITE_OTHER): Payer: Medicare Other | Admitting: Cardiology

## 2018-07-23 ENCOUNTER — Encounter: Payer: Self-pay | Admitting: Cardiology

## 2018-07-23 VITALS — BP 112/84 | HR 64 | Ht 70.5 in | Wt 179.2 lb

## 2018-07-23 DIAGNOSIS — I482 Chronic atrial fibrillation, unspecified: Secondary | ICD-10-CM | POA: Insufficient documentation

## 2018-07-23 NOTE — Patient Instructions (Signed)
Medication Instructions:  Continue current medications  If you need a refill on your cardiac medications before your next appointment, please call your pharmacy.  Labwork: None Ordered   Take the provided lab slips with you to the lab for your blood draw.   When you have your labs (blood work) drawn today and your tests are completely normal, you will receive your results only by MyChart Message (if you have MyChart) -OR-  A paper copy in the mail.  If you have any lab test that is abnormal or we need to change your treatment, we will call you to review these results.  Testing/Procedures: None Ordered   Follow-Up: You will need a follow up appointment in 1 Year.  Please call our office 2 months in advance to schedule this appointment.  You may see Dr Hochrein or one of the following Advanced Practice Providers on your designated Care Team:   Rhonda Barrett, PA-C . Kathryn Lawrence, DNP, ANP    At [facility], you and your health needs are our priority.  As part of our continuing mission to provide you with exceptional heart care, we have created designated Provider Care Teams.  These Care Teams include your primary Cardiologist (physician) and Advanced Practice Providers (APPs -  Physician Assistants and Nurse Practitioners) who all work together to provide you with the care you need, when you need it.   Thank you for choosing [facility] at [facility]!!     

## 2018-08-07 ENCOUNTER — Telehealth: Payer: Self-pay | Admitting: Cardiology

## 2018-08-07 NOTE — Telephone Encounter (Signed)
° °  Patient calling to report "strange feeling " in his chest, No pain. No discomfort. Denies being SOB Episodes started about 1 week ago. Patient has questions about afib.

## 2018-08-07 NOTE — Telephone Encounter (Signed)
Call and ask if the questions are happening every day.  If they are then he can have a 24 hour Holter.  If not he should have an event monitor 3 weeks.

## 2018-08-07 NOTE — Telephone Encounter (Signed)
Pt called to report that since he saw Dr. Antoine Poche 2 weeks ago... he has been having a strange feeling in his chest and he is not sure if it is Afib but feels like his "heart flips over" and he has to take a deep breath... he does not know what his VS are and cannot find a pulse to check it himself... he says he never could do that... he denies dizziness, sob, chest pain... he says it has been happening a couple times over the past week and not associated with exertion... he says he otherwise feels well... will forward to Dr. Antoine Poche for his advice and recommendation.

## 2018-08-13 NOTE — Telephone Encounter (Signed)
Spoke to patient he stated for the past 3 weeks he has noticed he gets sob with the least exertion.Stated he gets sob walking up 1 flight of stairs.Stated he does not notice fast heart beat.He does notice his heart seems to flip flop every day. No chest pain.He does have a little swelling in both lower legs and has gained 5 lbs within the past 1 month.Dr.Hochrein recommended 24 hour holter monitor. No extender appointment available this week.Appointment scheduled with Joni Reining DNP 08/20/18 at 9:30 am.Advised I will send message for Dr.Hochrein to review since he is not aware of your sob.

## 2018-08-13 NOTE — Telephone Encounter (Signed)
Patient called today, he states that even when he just goes up one flight of stairs he gets out of breathe, feels more winded.  Even just walking up an hill. He feels something is different.  Not sure if it's due to his A-FIB, age, or taking apixaban (ELIQUIS) 5 MG TABS tablet.  He states he has always be very active, a long distance running.  He just wanted to add this new information.

## 2018-08-17 NOTE — Progress Notes (Signed)
Cardiology Office Note   Date:  08/20/2018   ID:  Andre Parker, DOB 04-01-1946, MRN [id number]  PCP:  Roger Kill, PA-C  Cardiologist:  Dr. Antoine Poche  Chief Complaint  Patient presents with  . Shortness of Breath  . Atrial Fibrillation     History of Present Illness: Andre Parker is a 73 y.o. male who presents for ongoing assessment and management of atrial fibrillation with CHADS VASC Score of 1 (stroke risk of 1.5%). He was seen last by Dr. Antoine Poche on 07/23/2018 and was doing well without complaints.   He called our office on 08/13/2018 with complaints of worsening DOE and rapid HR which had been ongoing for 3 weeks, especially when walking up stairs. He also reported a 5 lb weight gain and LEE   He also comes with a list of other complaints which he has written down. These include, bilateral shoulder pain,  bilateral knee pain with walking, feet and toes feeling cold, early satiety, coughing , generalized fatigue. His family has been noticing a change in his stamina and increased DOE.  He denies bleeding, dizziness or near syncope.   Past Medical History:  Diagnosis Date  . Atrial fibrillation (HCC)   . Low back pain     Past Surgical History:  Procedure Laterality Date  . LUMBAR LAMINECTOMY    . ROTATOR CUFF REPAIR Right   . TONSILLECTOMY    . VASECTOMY       Current Outpatient Medications  Medication Sig Dispense Refill  . apixaban (ELIQUIS) 5 MG TABS tablet Take 1 tablet (5 mg total) by mouth 2 (two) times daily. 60 tablet 11  . Ascorbic Acid (VITAMIN C PO) Take 500 mg by mouth 2 (two) times daily.     . Calcium Citrate (CAL-CITRATE PO) Take 250 mg by mouth 3 (three) times daily.     . chlorpheniramine (CHLOR-TRIMETON) 4 MG tablet Take 4 mg by mouth as needed for allergies.    . Cholecalciferol (VITAMIN D3) 1000 units CAPS Take 1 capsule by mouth daily.     . ferrous sulfate 325 (65 FE) MG tablet Take 325 mg by mouth daily with breakfast.    . ibuprofen  (ADVIL,MOTRIN) 200 MG tablet Take 200 mg by mouth every 6 (six) hours as needed.    . Magnesium 250 MG TABS Take 1 tablet by mouth daily.    . Multiple Vitamin (MULTIVITAMIN) tablet Take 1 tablet by mouth daily.    . furosemide (LASIX) 20 MG tablet Take 1 tablet (20 mg total) by mouth daily as needed for edema (wt gain). For 2 days then as needed for weight gain >3#/day or 5#/week 30 tablet 3   No current facility-administered medications for this visit.     Allergies:   Patient has no known allergies.    Social History:  The patient  reports that he has never smoked. He has never used smokeless tobacco.   Family History:  The patient's family history includes Heart disease in his father; Myelodysplastic syndrome in his sister; Prostate cancer in his brother and father.    ROS: All other systems are reviewed and negative. Unless otherwise mentioned in H&P    PHYSICAL EXAM: VS:  BP 112/78   Pulse 72   Ht 5' 10.5" (1.791 m)   Wt 176 lb (79.8 kg)   BMI 24.90 kg/m  , BMI Body mass index is 24.9 kg/m. GEN: Well nourished, well developed, in no acute distress HEENT: normal Neck: no JVD, carotid bruits,  or masses Cardiac: IRRR; no murmurs, rubs, or gallops, 1+ dependent edema  Respiratory:  Clear to auscultation bilaterally, normal work of breathing GI: soft, nontender, nondistended, + BS MS: no deformity or atrophy Skin: warm and dry, no rash Neuro:  Strength and sensation are intact Psych: euthymic mood, full affect   EKG:  Atrial fib with RBBB, rate of 72 bpm. (Unchanged from EKG 04/19/2018).  Recent Labs: 04/18/2018: BUN 20; Creatinine, Ser 0.85; Hemoglobin 16.0; Platelets 178; Potassium 4.4; Sodium 137    Lipid Panel No results found for: CHOL, TRIG, HDL, CHOLHDL, VLDL, LDLCALC, LDLDIRECT    Wt Readings from Last 3 Encounters:  08/20/18 176 lb (79.8 kg)  07/23/18 179 lb 3.2 oz (81.3 kg)  04/18/18 173 lb (78.5 kg)      Other studies Reviewed: Echocardiogram  09/26/2016 was normal. There was mild   concentric hypertrophy. Systolic function was normal. The   estimated ejection fraction was in the range of 55% to 60%. Wall   motion was normal; there were no regional wall motion   abnormalities. - Aortic valve: Trileaflet; normal thickness, mildly calcified   leaflets. - Mitral valve: There was trivial regurgitation. - Left atrium: Anterior-posterior dimension: 37 mm. - Right atrium: The atrium was mildly dilated. - Tricuspid valve: There was mild regurgitation. - Pulmonary arteries: PA peak pressure: 32 mm Hg (S).  ASSESSMENT AND PLAN:  1. Atrial fib: Rate is controlled. He denies any rapid HR or palpitations at this time. He was having occasional extra beats a month ago. I will hold off on the Holter monitor for now as I plan to repeat some other testing first. He will have a CBC to evaluate for anemia on Eliquis. No changes. Once echo and stress test is completed, will consider Holter.   2. DOE: Has complaints of wt gain of over 5 lbs with some mild LEE and coughing. I do not see significant edema. Only 1+ in the dependent position. I will have him complete echocardiogram for changes in LV fx. He is given a Rx for lasix to take prn only. He can take a dose today, weigh himself, and if weight is not down at least 3 lbs, he may take one more dose, and then PRN for weight gain of 5+ lbs. Check BMET in one week  3. Increasing fatigue: Will plan a NM stress test for evaluation for ischemia causing these symptoms. I have explained the test and answered multiple questions.  He has not had ischemic testing in 15 years. Check TSH  4. Musculoskeletal pain: Atypical for cardiac pain, shoulder and back pain with knee pain. The pain is constant, with knee pain worsening with walking. He will need to see PCP for further testing.   5. Cold feet and toes: Doppler ultrasound was completed by portable monitor in the exam room and it was difficult to palpate with  Afib. They were strong.    Current medicines are reviewed at length with the patient today.  I have spent greater than 30 minutes with this patient and his wife answering multiple questions and addressing concerns.   Labs/ tests ordered today include: BMET, CBC, TSH, Echocardiogram and NM stress test.  Bettey Mare. Liborio Nixon, ANP, [facility]   08/20/2018 10:46 AM    [facility] [address] Office [phone] Fax [phone]

## 2018-08-20 ENCOUNTER — Encounter: Payer: Self-pay | Admitting: Adult Health

## 2018-08-20 ENCOUNTER — Ambulatory Visit (INDEPENDENT_AMBULATORY_CARE_PROVIDER_SITE_OTHER): Payer: Medicare Other | Admitting: Adult Health

## 2018-08-20 VITALS — BP 112/78 | HR 72 | Ht 70.5 in | Wt 176.0 lb

## 2018-08-20 DIAGNOSIS — Z79899 Other long term (current) drug therapy: Secondary | ICD-10-CM | POA: Diagnosis not present

## 2018-08-20 DIAGNOSIS — R079 Chest pain, unspecified: Secondary | ICD-10-CM | POA: Diagnosis not present

## 2018-08-20 DIAGNOSIS — I4811 Longstanding persistent atrial fibrillation: Secondary | ICD-10-CM

## 2018-08-20 DIAGNOSIS — R0602 Shortness of breath: Secondary | ICD-10-CM

## 2018-08-20 DIAGNOSIS — R5383 Other fatigue: Secondary | ICD-10-CM

## 2018-08-20 MED ORDER — FUROSEMIDE 20 MG PO TABS: 20.0000 mg | ORAL_TABLET | Freq: Every day | ORAL | 3 refills | Status: DC | PRN

## 2018-08-20 NOTE — Patient Instructions (Signed)
Medication Instructions:  TAKE FUROSEMIDE 20MG  X2 DAYS THEN TAKE AS NEEDED FOR WEIGHT GAIN >3#/DAY OR 5#/WEEK If you need a refill on your cardiac medications before your next appointment, please call your pharmacy.  Labwork: IN 1 WEEK= FASTING LIPID,BMET AND TSH HERE IN OUR OFFICE AT [facility]   You will need to fast. DO NOT EAT OR DRINK PAST MIDNIGHT.      Take the provided lab slips with you to the lab for your blood draw.    When you have your labs (blood work) drawn today and your tests are completely normal, you will receive your results only by MyChart Message (if you have MyChart) -OR-  A paper copy in the mail.  If you have any lab test that is abnormal or we need to change your treatment, we will call you to review these results.  Testing/Procedures: Your physician has requested that you have an Exercise Myoview. A cardiac stress test is a cardiological test that measures the heart's ability to respond to external stress in a controlled clinical environment. The stress response is induced by exercise (exercise-treadmill). For further information please visit [email or web address] If you have questions or concerns about your appointment, you can call the Nuclear Lab at [phone].   Echocardiogram - Your physician has requested that you have an echocardiogram. Echocardiography is a painless test that uses sound waves to create images of your heart. It provides your doctor with information about the size and shape of your heart and how well your heart's chambers and valves are working. This procedure takes approximately one hour. There are no restrictions for this procedure. This will be performed at our [facility] location - [address].  Special Instructions: LOG YOUR WEIGHT DAILY AND BRING LOG TO YOUR APPOINTMENTS  Follow-Up: You will need a follow up appointment in 3 weeks.  Please call our office 2 months in advance to schedule this appointment.  You may see Rollene Rotunda, MD or one of the following Advanced Practice Providers on your designated Care Team:  Theodore Demark, PA-C, Joni Reining, DNP, AACC   At [facility], you and your health needs are our priority.  As part of our continuing mission to provide you with exceptional heart care, we have created designated Provider Care Teams.  These Care Teams include your primary Cardiologist (physician) and Advanced Practice Providers (APPs -  Physician Assistants and Nurse Practitioners) who all work together to provide you with the care you need, when you need it.  Thank you for choosing [facility] at [facility]!!

## 2018-08-28 ENCOUNTER — Telehealth (HOSPITAL_COMMUNITY): Payer: Self-pay

## 2018-08-28 LAB — BASIC METABOLIC PANEL
BUN / CREAT RATIO: 20 (ref 10–24)
BUN: 18 mg/dL (ref 8–27)
CO2: 23 mmol/L (ref 20–29)
Calcium: 8.5 mg/dL — ABNORMAL LOW (ref 8.6–10.2)
Chloride: 102 mmol/L (ref 96–106)
Creatinine, Ser: 0.91 mg/dL (ref 0.76–1.27)
GFR calc Af Amer: 96 mL/min/{1.73_m2} (ref >59)
GFR calc non Af Amer: 83 mL/min/{1.73_m2} (ref >59)
Glucose: 84 mg/dL (ref 65–99)
POTASSIUM: 4.8 mmol/L (ref 3.5–5.2)
Sodium: 140 mmol/L (ref 134–144)

## 2018-08-28 LAB — CBC
Hematocrit: 47.6 % (ref 37.5–51.0)
Hemoglobin: 16.1 g/dL (ref 13.0–17.7)
MCH: 34 pg — AB (ref 26.6–33.0)
MCHC: 33.8 g/dL (ref 31.5–35.7)
MCV: 100 fL — AB (ref 79–97)
Platelets: 219 10*3/uL (ref 150–450)
RBC: 4.74 x10E6/uL (ref 4.14–5.80)
RDW: 12 % (ref 11.6–15.4)
WBC: 3.6 10*3/uL (ref 3.4–10.8)

## 2018-08-28 LAB — TSH: TSH: 2.92 u[IU]/mL (ref 0.450–4.500)

## 2018-08-28 NOTE — Telephone Encounter (Signed)
Ov 03/09 with Joni Reining

## 2018-08-28 NOTE — Telephone Encounter (Signed)
Unable to reach patient x2. Encounter complete.

## 2018-08-30 ENCOUNTER — Ambulatory Visit (HOSPITAL_COMMUNITY)
Admission: RE | Admit: 2018-08-30 | Discharge: 2018-08-30 | Disposition: A | Discharge status: Home / Self Care | Payer: Medicare Other | Source: Ambulatory Visit | Attending: Cardiology | Admitting: Cardiology

## 2018-08-30 ENCOUNTER — Ambulatory Visit (HOSPITAL_BASED_OUTPATIENT_CLINIC_OR_DEPARTMENT_OTHER): Payer: Medicare Other

## 2018-08-30 ENCOUNTER — Other Ambulatory Visit: Payer: Self-pay

## 2018-08-30 DIAGNOSIS — I4811 Longstanding persistent atrial fibrillation: Secondary | ICD-10-CM | POA: Insufficient documentation

## 2018-08-30 DIAGNOSIS — R0602 Shortness of breath: Secondary | ICD-10-CM

## 2018-08-30 DIAGNOSIS — R079 Chest pain, unspecified: Secondary | ICD-10-CM | POA: Insufficient documentation

## 2018-08-30 LAB — MYOCARDIAL PERFUSION IMAGING
CHL CUP NUCLEAR SDS: 0
CHL CUP NUCLEAR SRS: 0
CHL CUP RESTING HR STRESS: 72 {beats}/min
LV dias vol: 90 mL (ref 62–150)
LV sys vol: 35 mL
NUC STRESS TID: 1.28
Peak HR: 107 {beats}/min
SSS: 0

## 2018-08-30 LAB — ECHOCARDIOGRAM COMPLETE
Height: 71 in
Weight: 2816 oz

## 2018-08-30 MED ORDER — TECHNETIUM TC 99M TETROFOSMIN IV KIT
9.4000 | PACK | Freq: Once | INTRAVENOUS | Status: AC | PRN
  Administered 2018-08-30: 9.4 via INTRAVENOUS
  Filled 2018-08-30: qty 10

## 2018-08-30 MED ORDER — TECHNETIUM TC 99M TETROFOSMIN IV KIT
30.9000 | PACK | Freq: Once | INTRAVENOUS | Status: AC | PRN
  Administered 2018-08-30: 30.9 via INTRAVENOUS
  Filled 2018-08-30: qty 31

## 2018-08-30 MED ORDER — REGADENOSON 0.4 MG/5ML IV SOLN
0.4000 mg | Freq: Once | INTRAVENOUS | Status: AC
  Administered 2018-08-30: 0.4 mg via INTRAVENOUS

## 2018-08-31 ENCOUNTER — Encounter: Payer: Self-pay | Admitting: Cardiology

## 2018-09-07 ENCOUNTER — Encounter: Payer: Self-pay | Admitting: Cardiology

## 2018-09-07 ENCOUNTER — Ambulatory Visit (INDEPENDENT_AMBULATORY_CARE_PROVIDER_SITE_OTHER): Payer: Medicare Other | Admitting: Cardiology

## 2018-09-07 VITALS — BP 129/74 | HR 82 | Ht 71.0 in | Wt 166.4 lb

## 2018-09-07 DIAGNOSIS — R0602 Shortness of breath: Secondary | ICD-10-CM | POA: Diagnosis not present

## 2018-09-07 DIAGNOSIS — Z7901 Long term (current) use of anticoagulants: Secondary | ICD-10-CM | POA: Diagnosis not present

## 2018-09-07 DIAGNOSIS — Z79899 Other long term (current) drug therapy: Secondary | ICD-10-CM | POA: Diagnosis not present

## 2018-09-07 DIAGNOSIS — I482 Chronic atrial fibrillation, unspecified: Secondary | ICD-10-CM

## 2018-09-07 NOTE — Progress Notes (Signed)
Virtual Visit via Video Note    Evaluation Performed:  Follow-up visit  This visit type was conducted due to national recommendations for restrictions regarding the COVID-19 Pandemic (e.g. social distancing).  This format is felt to be most appropriate for this patient at this time.  All issues noted in this document were discussed and addressed.  No physical exam was performed (except for noted visual exam findings with Video Visits).  Please refer to the patient's chart (MyChart message for video visits and phone note for telephone visits) for the patient's consent to telehealth for [facility].  Date:  09/09/2018   ID:  Andre Parker, DOB 1946/03/25, MRN [id number]  Patient Location:  [address] [place], [place] [address]  Provider location:   [place], [place]  PCP:  Roger Kill, PA-C  Cardiologist:  Rollene Rotunda, MD  Electrophysiologist:  None   Chief Complaint:  Atrial fib  History of Present Illness:    Andre Parker is a 73 y.o. male who presents via audio/video conferencing for a telehealth visit today.   The patient has been doing well. The patient denies any new symptoms such as chest discomfort, neck or arm discomfort. There has been no new PND or orthopnea. There have been no reported palpitations, presyncope or syncope.  He has been active.  He does have some increased dyspnea with exertion but is also still walking and active.  He is limited by some joint problems that cause significant pain.   He had SOB recently but in ischemia on recent echo.  He had NL valve function and LV function on echo.  He has chronic atrial fib and does not feel this.   The patient does not symptoms concerning for COVID-19 infection (fever, chills, cough, or new shortness of breath).    Prior CV studies:   The following studies were reviewed today:  Echo.  Past Medical History:  Diagnosis Date  . Atrial fibrillation (HCC)   . Low back pain    Past Surgical History:   Procedure Laterality Date  . LUMBAR LAMINECTOMY    . ROTATOR CUFF REPAIR Right   . TONSILLECTOMY    . VASECTOMY       Current Meds  Medication Sig  . apixaban (ELIQUIS) 5 MG TABS tablet Take 1 tablet (5 mg total) by mouth 2 (two) times daily.  . Ascorbic Acid (VITAMIN C PO) Take 500 mg by mouth 2 (two) times daily.   . Calcium Citrate (CAL-CITRATE PO) Take 250 mg by mouth 3 (three) times daily.   . chlorpheniramine (CHLOR-TRIMETON) 4 MG tablet Take 4 mg by mouth as needed for allergies.  . Cholecalciferol (VITAMIN D3) 1000 units CAPS Take 1 capsule by mouth daily.   . ferrous sulfate 325 (65 FE) MG tablet Take 325 mg by mouth daily with breakfast.  . furosemide (LASIX) 20 MG tablet Take 1 tablet (20 mg total) by mouth daily as needed for edema (wt gain). For 2 days then as needed for weight gain >3#/day or 5#/week  . ibuprofen (ADVIL,MOTRIN) 200 MG tablet Take 200 mg by mouth every 6 (six) hours as needed.  . Magnesium 250 MG TABS Take 1 tablet by mouth daily.  . Multiple Vitamin (MULTIVITAMIN) tablet Take 1 tablet by mouth daily.     Allergies:   Patient has no known allergies.   Social History   Tobacco Use  . Smoking status: Never Smoker  . Smokeless tobacco: Never Used  Substance Use Topics  . Alcohol  use: Never    Frequency: Never  . Drug use: Never     Family Hx: The patient's family history includes Heart disease in his father; Myelodysplastic syndrome in his sister; Prostate cancer in his brother and father.  ROS:   Please see the history of present illness.    As stated in the HPI and negative for all other systems.   Labs/Other Tests and Data Reviewed:    Recent Labs: 08/27/2018: BUN 18; Creatinine, Ser 0.91; Hemoglobin 16.1; Platelets 219; Potassium 4.8; Sodium 140; TSH 2.920   Recent Lipid Panel No results found for: CHOL, TRIG, HDL, CHOLHDL, LDLCALC, LDLDIRECT  Wt Readings from Last 3 Encounters:  09/07/18 166 lb 6.4 oz (75.5 kg)  08/30/18 176 lb (79.8  kg)  08/20/18 176 lb (79.8 kg)     Exam:    Vital Signs:  BP 129/74   Pulse 82   Ht 5\' 11"  (1.803 m)   Wt 166 lb 6.4 oz (75.5 kg)   BMI 23.21 kg/m    Well nourished, well developed male in no acute distress.   ASSESSMENT & PLAN:    Atrial fib:      He does not feel this.  At this point, no change in therapy.  Continue with anticoagulation.   DOE:     He has had a thorough work up and I don't see a clear etiology.  No change in therapy is indicated.  Continue current meds.     Musculoskeletal pain: Atypical for cardiac pain.  No further work up.  Negative stress test as listed.     COVID-19 Education: The signs and symptoms of COVID-19 were discussed with the patient and how to seek care for testing (follow up with PCP or arrange E-visit).  The importance of social distancing was discussed today.  Patient Risk:   After full review of this patients clinical status, I feel that they are at least moderate risk at this time.  Time:   Today, I have spent 25  minutes with the patient with telehealth technology discussing .     Medication Adjustments/Labs and Tests Ordered: Current medicines are reviewed at length with the patient today.  Concerns regarding medicines are outlined above.  Tests Ordered: No orders of the defined types were placed in this encounter.  Medication Changes: No orders of the defined types were placed in this encounter.   Disposition:  Follow up 6 months  Signed, Rollene Rotunda, MD  09/09/2018 4:41 PM    [facility]

## 2018-09-09 ENCOUNTER — Encounter: Payer: Self-pay | Admitting: Cardiology

## 2018-09-09 DIAGNOSIS — R0602 Shortness of breath: Secondary | ICD-10-CM | POA: Insufficient documentation

## 2018-09-11 ENCOUNTER — Ambulatory Visit: Payer: Medicare Other | Admitting: Cardiology

## 2018-11-27 ENCOUNTER — Telehealth: Payer: Self-pay

## 2018-11-27 NOTE — Telephone Encounter (Signed)
   [facility] Pre-operative Risk Assessment    Request for surgical clearance:  1. What type of surgery is being performed?   LUMBAR ESI  2. When is this surgery scheduled? 12/04/2018   3. What type of clearance is required (medical clearance vs. Pharmacy clearance to hold med vs. Both)? PHARMACY  4. Are there any medications that need to be held prior to surgery and how long? HOLD ELIQUIS FOR 3 DAYS  5. Practice name and name of physician performing surgery? [facility] W/DR.RAMOS  6. What is your office phone number? [phone]   7.   What is your office fax number? [phone]  8.   Anesthesia type (None, local, MAC, general) ? N/A   Andre Parker 11/27/2018, 4:27 PM  _________________________________________________________________   (provider comments below)

## 2018-11-28 NOTE — Telephone Encounter (Signed)
Patient with diagnosis of afib on Eliquis for anticoagulation.    Procedure: LUMBAR ESI       Date of procedure: 12/04/2018  CHADS2-VASc score of  1 (CHF, HTN, AGE, DM2, stroke/tia x 2, CAD, AGE, male)  CrCl 72ml/min  Per office protocol, patient can hold Eliquis for 3 days prior to procedure.

## 2018-11-29 ENCOUNTER — Telehealth: Payer: Self-pay | Admitting: Cardiology

## 2018-11-29 NOTE — Telephone Encounter (Signed)
Attempted to return call to patient with advice. Phone rang, no answer. MyChart message sent

## 2018-11-29 NOTE — Telephone Encounter (Signed)
Patient will be off eliquis for 3 days for lumbar ESI. OK to donate blood?   Routed to CV[facility]

## 2018-11-29 NOTE — Telephone Encounter (Signed)
New message   Patient would like to know if he can give blood if he is going to be off eliquis for 3 days per the prior message. Please call.

## 2018-11-29 NOTE — Telephone Encounter (Signed)
I would not recommend.  Even though it's "out of his system", it may not be 100% and we don't need the blood not clotting normally in the recipient.  Thank him for wanting to.Marland KitchenMarland Kitchen

## 2018-11-29 NOTE — Telephone Encounter (Signed)
   Primary Cardiologist: Minus Breeding, MD  Chart reviewed as part of pre-operative protocol coverage. Given past medical history and time since last visit, based on ACC/AHA guidelines, Shamarion Coots would be at acceptable risk for the planned procedure without further cardiovascular testing.   Per pharmacy: Patient with diagnosis of afib on Eliquis for anticoagulation.    Procedure: LUMBAR ESI Date of procedure: 12/04/2018  CHADS2-VASc score of  1 (CHF, HTN, AGE, DM2, stroke/tia x 2, CAD, AGE, male)  CrCl 56ml/min  Per office protocol, patient can hold Eliquis for 3 days prior to procedure.    I will route this recommendation to the requesting party via Epic fax function and remove from pre-op pool.  Please call with questions.  Kathyrn Drown, NP 11/29/2018, 10:52 AM

## 2018-11-30 NOTE — Telephone Encounter (Signed)
Message routed to MD to advise patient - giving blood, calcium supplement. He was provided PharmD advice via MyChart message already

## 2018-12-02 NOTE — Telephone Encounter (Signed)
OK to give blood.  I would discuss the benefits of the calcium supplement with Heywood Bene, PA-C.  If there is no significant benefit then reduce or discontinue per their advice.  From a cardiac standpoint the risk is minimal.

## 2018-12-03 NOTE — Telephone Encounter (Signed)
MD message sent to patient via MyChart

## 2019-03-24 NOTE — Progress Notes (Signed)
Cardiology Office Note   Date:  03/25/2019   ID:  Andre Parker, DOB 1945-11-26, MRN [id number]  PCP:  Heywood Bene, PA-C  Cardiologist:   Minus Breeding, MD    Chief Complaint  Patient presents with  . Atrial Fibrillation      History of Present Illness: Andre Parker is a 73 y.o. male who presents for evaluation of atrial fib.  He has had a Holter with persistent atrial fib with good rate control.  His echo was normal.   Stress perfusion study was normal.    Since I last saw him he has done well.  He has been Art therapist.  The patient denies any new symptoms such as chest discomfort, neck or arm discomfort. There has been no new shortness of breath, PND or orthopnea. There have been no reported palpitations, presyncope or syncope.   Past Medical History:  Diagnosis Date  . Atrial fibrillation ([place])   . Low back pain     Past Surgical History:  Procedure Laterality Date  . LUMBAR LAMINECTOMY    . ROTATOR CUFF REPAIR Right   . TONSILLECTOMY    . VASECTOMY       Current Outpatient Medications  Medication Sig Dispense Refill  . acetaminophen (TYLENOL) 325 MG tablet Take 325 mg by mouth every 6 (six) hours as needed.    Marland Kitchen apixaban (ELIQUIS) 5 MG TABS tablet Take 1 tablet (5 mg total) by mouth 2 (two) times daily. 60 tablet 11  . Ascorbic Acid (VITAMIN C PO) Take 500 mg by mouth 2 (two) times daily.     . Calcium Citrate (CAL-CITRATE PO) Take 250 mg by mouth 3 (three) times daily.     . chlorpheniramine (CHLOR-TRIMETON) 4 MG tablet Take 4 mg by mouth as needed for allergies.    . Cholecalciferol (VITAMIN D3) 1000 units CAPS Take 1 capsule by mouth daily.     . ferrous sulfate 325 (65 FE) MG tablet Take 325 mg by mouth daily with breakfast.    . Magnesium 250 MG TABS Take 1 tablet by mouth daily.    . Melatonin-Pyridoxine (MELATIN PO) Take by mouth.    . Multiple Vitamin (MULTIVITAMIN) tablet Take 1 tablet by mouth daily.     No current facility-administered  medications for this visit.     Allergies:   Patient has no known allergies.    ROS:  Please see the history of present illness.   Otherwise, review of systems are positive none.   All other systems are reviewed and negative.    PHYSICAL EXAM: VS:  BP 112/69   Pulse 77   Temp (!) 96.9 F (36.1 C)   Ht 5\' 11"  (1.803 m)   Wt 166 lb 3.2 oz (75.4 kg)   SpO2 97%   BMI 23.18 kg/m  , BMI Body mass index is 23.18 kg/m.  GENERAL:  Well appearing NECK:  No jugular venous distention, waveform within normal limits, carotid upstroke brisk and symmetric, no bruits, no thyromegaly LUNGS:  Clear to auscultation bilaterally CHEST:  Unremarkable HEART:  PMI not displaced or sustained,S1 and S2 within normal limits, no S3,  no clicks, no rubs, no murmurs, irregular ABD:  Flat, positive bowel sounds normal in frequency in pitch, no bruits, no rebound, no guarding, no midline pulsatile mass, no hepatomegaly, no splenomegaly EXT:  2 plus pulses throughout, no edema, no cyanosis no clubbing    EKG:  EKG is not ordered today.   Recent Labs: 08/27/2018:  BUN 18; Creatinine, Ser 0.91; Hemoglobin 16.1; Platelets 219; Potassium 4.8; Sodium 140; TSH 2.920    Lipid Panel No results found for: CHOL, TRIG, HDL, CHOLHDL, VLDL, LDLCALC, LDLDIRECT    Wt Readings from Last 3 Encounters:  03/25/19 166 lb 3.2 oz (75.4 kg)  09/07/18 166 lb 6.4 oz (75.5 kg)  08/30/18 176 lb (79.8 kg)      Other studies Reviewed: Additional studies/ records that were reviewed today include: None Review of the above records demonstrates:   NA    ASSESSMENT AND PLAN:   ATRIAL FIB:  [name] has a CHA2DS2 - VASc score of 1 with a risk of 1.5%.  He chose to take Eliquis.   He tolerates anticoagulation.  No change in therapy.    Current medicines are reviewed at length with the patient today.  The patient does not have concerns regarding medicines.  The following changes have been made:  None  Labs/ tests  ordered today include:  None No orders of the defined types were placed in this encounter.    Disposition:   FU with me in 12 months.    Signed, Rollene Rotunda, MD  03/25/2019 4:32 PM    [facility]

## 2019-03-25 ENCOUNTER — Encounter: Payer: Self-pay | Admitting: Cardiology

## 2019-03-25 ENCOUNTER — Other Ambulatory Visit: Payer: Self-pay

## 2019-03-25 ENCOUNTER — Ambulatory Visit (INDEPENDENT_AMBULATORY_CARE_PROVIDER_SITE_OTHER): Payer: Medicare Other | Admitting: Cardiology

## 2019-03-25 VITALS — BP 112/69 | HR 77 | Temp 96.9°F | Ht 71.0 in | Wt 166.2 lb

## 2019-03-25 DIAGNOSIS — I4821 Permanent atrial fibrillation: Secondary | ICD-10-CM

## 2019-03-25 NOTE — Patient Instructions (Signed)

## 2019-05-02 ENCOUNTER — Other Ambulatory Visit: Payer: Self-pay | Admitting: Cardiology

## 2019-05-13 ENCOUNTER — Telehealth: Payer: Self-pay | Admitting: *Deleted

## 2019-05-13 NOTE — Telephone Encounter (Signed)
Patient with diagnosis of atrial fibrillation on Eliquis for anticoagulation.    Procedure: Lumbar ESI Date of procedure: 05/21/2019  CHADS2-VASc score of  1 (AGE)  CrCl 76 Platelet count 219  Per office protocol, patient can hold Eliquis for 3 days prior to procedure.

## 2019-05-13 NOTE — Telephone Encounter (Signed)
   [facility] Pre-operative Risk Assessment    Request for surgical clearance:  1. What type of surgery is being performed? LUMBAR ESI    2. When is this surgery scheduled? 05/21/19    3. What type of clearance is required (medical clearance vs. Pharmacy clearance to hold med vs. Both)? MEDICAL   4. Are there any medications that need to be held prior to surgery and how long?ELIQUIS 3 DAYS PRIOR    5. Practice name and name of physician performing surgery? DR Nelva Bush, [facility]    6. What is your office phone number?[phone]    7.   What is your office fax number? [place]   Anesthesia type (None, local, MAC, general) ?

## 2019-05-14 NOTE — Telephone Encounter (Signed)
   Primary Cardiologist: Minus Breeding, MD  Chart reviewed as part of pre-operative protocol coverage. Given past medical history and time since last visit, based on ACC/AHA guidelines, Andre Parker would be at acceptable risk for the planned procedure without further cardiovascular testing. He has persistent atrial fibrillation, rate controlled, on Eliquis for anticoagulation.   CHADS2-VASc score of  1 (AGE) CrCl 76 Platelet count 219  Per office protocol, patient can hold Eliquis for 3 days prior to procedure.   I will route this recommendation to the requesting party via Epic fax function and remove from pre-op pool.  Please call with questions.  Daune Perch, NP 05/14/2019, 9:53 AM

## 2019-05-29 ENCOUNTER — Other Ambulatory Visit: Payer: Self-pay | Admitting: Cardiology

## 2019-05-29 MED ORDER — APIXABAN 5 MG PO TABS: 5.0000 mg | ORAL_TABLET | Freq: Two times a day (BID) | ORAL | 1 refills | Status: DC

## 2019-05-29 NOTE — Addendum Note (Signed)
Addended by: Gaetano Net on: 05/29/2019 01:35 PM   Modules accepted: Orders

## 2019-06-14 HISTORY — PX: ROTATOR CUFF REPAIR: SHX139

## 2019-07-03 ENCOUNTER — Telehealth: Payer: Self-pay | Admitting: Cardiology

## 2019-07-03 NOTE — Telephone Encounter (Signed)
Spoke with pt and advised that letter will be uploaded to [facility],

## 2019-07-03 NOTE — Telephone Encounter (Signed)
New Message  Patient is calling in to get a letter from Dr. Antoine Poche stating that it is ok for him to have the covid vaccine even though he is taking Eliquis. This letter is a requirement for him to have the vaccine on 07/08/19. Please assist.  Patient's Contact Info: [phone] [email or web address]

## 2019-07-23 ENCOUNTER — Other Ambulatory Visit (HOSPITAL_COMMUNITY): Payer: Self-pay | Admitting: Interventional Radiology

## 2019-07-23 DIAGNOSIS — S32030S Wedge compression fracture of third lumbar vertebra, sequela: Secondary | ICD-10-CM

## 2019-07-23 DIAGNOSIS — S22080A Wedge compression fracture of T11-T12 vertebra, initial encounter for closed fracture: Secondary | ICD-10-CM

## 2019-07-23 DIAGNOSIS — S32040A Wedge compression fracture of fourth lumbar vertebra, initial encounter for closed fracture: Secondary | ICD-10-CM

## 2019-07-24 ENCOUNTER — Other Ambulatory Visit: Payer: Self-pay

## 2019-07-24 ENCOUNTER — Ambulatory Visit (HOSPITAL_COMMUNITY)
Admission: RE | Admit: 2019-07-24 | Discharge: 2019-07-24 | Disposition: A | Discharge status: Home / Self Care | Payer: Medicare Other | Source: Ambulatory Visit | Attending: Interventional Radiology | Admitting: Interventional Radiology

## 2019-07-24 DIAGNOSIS — S32040A Wedge compression fracture of fourth lumbar vertebra, initial encounter for closed fracture: Secondary | ICD-10-CM

## 2019-07-24 DIAGNOSIS — S22080A Wedge compression fracture of T11-T12 vertebra, initial encounter for closed fracture: Secondary | ICD-10-CM

## 2019-07-24 DIAGNOSIS — S32030S Wedge compression fracture of third lumbar vertebra, sequela: Secondary | ICD-10-CM

## 2019-07-24 NOTE — Consult Note (Signed)
Chief Complaint: Patient was seen in consultation today for T11, L3, and L4 compression fractures.  Referring Physician(s): Sheran Luz  Supervising Physician: Julieanne Cotton  Patient Status: [name] - Out-pt  History of Present Illness: Andre Parker is a 74 y.o. male with a past medical history as below, with pertinent past medical history including atrial fibrillation on chronic anticoagulation with Eliquis and chronic low back pain. He has had chronic low back pain for years, however it has increased in severity over the past few months. He went to see orthopedics for further evaluation who ordered appropriate imaging scans for further evaluation.  MR lumbar spine 06/12/2019: 1. Benign fractures T11 and L1 through L4. T11 acute/subacute age anterior wedge compression fracture with minimal posterior superior cortical retropulsion without canal stenosis. L1 and L2 chronic fractures. L3 acute inferior right endplate anterior and middle column compression fracture with minimal loss of height and prominent edema. L4 acute central superior endplate compressive deformity with prominent marrow edema. 2. L2-3 mild central, mild-moderate right and moderate left foraminal stenosis. 3. L3-4 moderate central canal stenosis and prominent moderate biforaminal stenosis. 4. L4-5 prominent moderate biforaminal stenosis, greater on the right with mild right L4 nerve root foraminal contact. 5. L5-S1 left laminotomy and discectomy without disc recurrence. Prominent moderate biforaminal stenosis without central stenosis.  NIR consulted by Dr. Ethelene Hal for management of T11, L3, and L4 compression fractures. Patient awake and alert sitting in chair. Complains of intermittent (almost daily) pain located mainly in the "right hip" area. Describes pain as "a pinched nerve". States he also has underlying low back pain, however his hip pain is more bothersome. States he currently has no pain at this moment. Rates  pain as 7-9/10 when it is at its worst. States that any activity worsens pain (walking to mailbox, grocery shopping, ect.). States that laying down and Ibuprofen improves pain. Denies low back pain (again, per patient pain mostly located in right hip area) or numbness/tingling down bilateral legs.  Currently taking Eliquis 5 mg twice daily.   Past Medical History:  Diagnosis Date  . Atrial fibrillation (HCC)   . Low back pain     Past Surgical History:  Procedure Laterality Date  . LUMBAR LAMINECTOMY    . ROTATOR CUFF REPAIR Right   . TONSILLECTOMY    . VASECTOMY      Allergies: Patient has no known allergies.  Medications: Prior to Admission medications   Medication Sig Start Date End Date Taking? Authorizing Provider  acetaminophen (TYLENOL) 325 MG tablet Take 325 mg by mouth every 6 (six) hours as needed.    [provider]  apixaban (ELIQUIS) 5 MG TABS tablet Take 1 tablet (5 mg total) by mouth 2 (two) times daily. 05/29/19   Rollene Rotunda, MD  Ascorbic Acid (VITAMIN C PO) Take 500 mg by mouth 2 (two) times daily.     [provider]  Calcium Citrate (CAL-CITRATE PO) Take 250 mg by mouth 3 (three) times daily.     [provider]  chlorpheniramine (CHLOR-TRIMETON) 4 MG tablet Take 4 mg by mouth as needed for allergies.    [provider]  Cholecalciferol (VITAMIN D3) 1000 units CAPS Take 1 capsule by mouth daily.     [provider]  ferrous sulfate 325 (65 FE) MG tablet Take 325 mg by mouth daily with breakfast.    [provider]  Magnesium 250 MG TABS Take 1 tablet by mouth daily.    [provider]  Melatonin-Pyridoxine (  MELATIN PO) Take by mouth.    [provider]  Multiple Vitamin (MULTIVITAMIN) tablet Take 1 tablet by mouth daily.    [provider]     Family History  Problem Relation Age of Onset  . Heart disease Father        Used NTG  . Prostate cancer Father   .  Myelodysplastic syndrome Sister   . Prostate cancer Brother     Social History   Socioeconomic History  . Marital status: Married    Spouse name: Not on file  . Number of children: Not on file  . Years of education: Not on file  . Highest education level: Not on file  Occupational History  . Not on file  Tobacco Use  . Smoking status: Never Smoker  . Smokeless tobacco: Never Used  Substance and Sexual Activity  . Alcohol use: Never  . Drug use: Never  . Sexual activity: Not on file  Other Topics Concern  . Not on file  Social History Narrative   Lives wife.   Retired Art gallery manager.   Social Determinants of Health   Financial Resource Strain:   . Difficulty of Paying Living Expenses: Not on file  Food Insecurity:   . Worried About Programme researcher, broadcasting/film/video in the Last Year: Not on file  . Ran Out of Food in the Last Year: Not on file  Transportation Needs:   . Lack of Transportation (Medical): Not on file  . Lack of Transportation (Non-Medical): Not on file  Physical Activity:   . Days of Exercise per Week: Not on file  . Minutes of Exercise per Session: Not on file  Stress:   . Feeling of Stress : Not on file  Social Connections:   . Frequency of Communication with Friends and Family: Not on file  . Frequency of Social Gatherings with Friends and Family: Not on file  . Attends Religious Services: Not on file  . Active Member of Clubs or Organizations: Not on file  . Attends Banker Meetings: Not on file  . Marital Status: Not on file     Review of Systems: A 12 point ROS discussed and pertinent positives are indicated in the HPI above.  All other systems are negative.  Review of Systems  Constitutional: Negative for chills and fever.  Respiratory: Negative for shortness of breath and wheezing.   Cardiovascular: Negative for chest pain and palpitations.  Musculoskeletal: Negative for back pain.       Positive for right hip pain.  Neurological: Negative for  numbness.  Psychiatric/Behavioral: Negative for behavioral problems and confusion.    Vital Signs: There were no vitals taken for this visit.  Physical Exam Constitutional:      General: He is not in acute distress.    Appearance: Normal appearance.  Pulmonary:     Effort: Pulmonary effort is normal. No respiratory distress.  Musculoskeletal:     Comments: No tenderness to palpation of spinous processes down entire spine.  Skin:    General: Skin is warm and dry.  Neurological:     Mental Status: He is alert and oriented to person, place, and time.  Psychiatric:        Mood and Affect: Mood normal.        Behavior: Behavior normal.      Imaging: No results found.  Labs:  CBC: Recent Labs    08/27/18 1154  WBC 3.6  HGB 16.1  HCT 47.6  PLT 219  COAGS: No results for input(s): INR, APTT in the last 8760 hours.  BMP: Recent Labs    08/27/18 1154  NA 140  K 4.8  CL 102  CO2 23  GLUCOSE 84  BUN 18  CALCIUM 8.5*  CREATININE 0.91  GFRNONAA 83  GFRAA 96     Assessment and Plan:  T11, L3, and L4 compression fractures. Explained to patient that Dr. Estanislado Pandy is in an emergent code stroke procedure that requires his immediate attention at this time. Because of this, he will not be able to meet with patient, and patient will be meeting with me today. Patient conveys understanding and agrees to meet with me today. Discussed patient's pain. Patient states that his pain comes and goes and that any activity can cause his pain (walking to mailbox, grocery shopping, ect.). States pain has mainly been located in his right hip area since 03/2019, however he has had underlying low back pain daily also. Discussed MR results with patient. Explained that he has three acute/subacute fractures based on this scan (T11, L3, and L4). Explained vertebral augmentation procedure, including risks and benefits. Explained that the indications of this procedure are to decrease his pain  and stabilize his fractures. However, based on physical exam, it does not appear that patient's pain is coming from his compression fractures (no pain with palpation of any spinous processes in entire spine)- therefore procedure might not help his hip pain, but will stabilize his fractures.  Dr. Estanislado Pandy called patient's cell phone after his emergent code stroke to further discuss case with patient. Again went over risks and benefits of procedure, explaining that procedure will help stabilize his fractures and help with his low back pain, however might not help his hip pain.  Plan for follow-up with an image-guided T11, L3, L4 KP/VP versus PRN. Patient to call our schedulers if he decides to move forward with procedure.  All questions answered and concerns addressed. Patient conveys understanding and agrees with plan.  Thank you for this interesting consult.  I greatly enjoyed meeting Andre Parker and look forward to participating in their care.  A copy of this report was sent to the requesting provider on this date.  Electronically Signed: Earley Abide, PA-C 07/24/2019, 9:14 AM   I spent a total of 40 Minutes in face to face in clinical consultation, greater than 50% of which was counseling/coordinating care for T11, L3, and L4 compression fractures.

## 2019-08-06 ENCOUNTER — Other Ambulatory Visit: Payer: Self-pay | Admitting: Physician Assistant

## 2019-08-06 DIAGNOSIS — T07XXXA Unspecified multiple injuries, initial encounter: Secondary | ICD-10-CM

## 2019-10-17 ENCOUNTER — Other Ambulatory Visit: Payer: Self-pay | Admitting: Physician Assistant

## 2019-10-17 DIAGNOSIS — Z13820 Encounter for screening for osteoporosis: Secondary | ICD-10-CM

## 2019-10-17 DIAGNOSIS — T07XXXA Unspecified multiple injuries, initial encounter: Secondary | ICD-10-CM

## 2019-10-21 ENCOUNTER — Other Ambulatory Visit: Payer: Self-pay

## 2019-10-21 ENCOUNTER — Ambulatory Visit
Admission: RE | Admit: 2019-10-21 | Discharge: 2019-10-21 | Disposition: A | Discharge status: Home / Self Care | Payer: Medicare Other | Source: Ambulatory Visit | Attending: Physician Assistant | Admitting: Physician Assistant

## 2019-10-21 DIAGNOSIS — Z13820 Encounter for screening for osteoporosis: Secondary | ICD-10-CM

## 2019-10-21 DIAGNOSIS — T07XXXA Unspecified multiple injuries, initial encounter: Secondary | ICD-10-CM

## 2019-12-03 ENCOUNTER — Other Ambulatory Visit: Payer: Self-pay | Admitting: Cardiology

## 2019-12-03 MED ORDER — APIXABAN 5 MG PO TABS: 5.0000 mg | ORAL_TABLET | Freq: Two times a day (BID) | ORAL | 1 refills | Status: DC

## 2019-12-03 NOTE — Telephone Encounter (Signed)
Pt c/o medication issue:  1. Name of Medication: apixaban (ELIQUIS) 5 MG TABS tablet  2. How are you currently taking this medication (dosage and times per day)? As directed  3. Are you having a reaction (difficulty breathing--STAT)? No  4. What is your medication issue? Patient just got a 30 day refill. He states that he would he has no refills left and needs authorization again. He states this time he would like to do 90 day refills as well rather than 30. Please advise.

## 2020-01-06 ENCOUNTER — Other Ambulatory Visit: Payer: Self-pay | Admitting: Cardiology

## 2020-01-06 ENCOUNTER — Telehealth: Payer: Self-pay | Admitting: Cardiology

## 2020-01-06 MED ORDER — APIXABAN 5 MG PO TABS: 5.0000 mg | ORAL_TABLET | Freq: Two times a day (BID) | ORAL | 1 refills | Status: DC

## 2020-01-06 NOTE — Telephone Encounter (Signed)
*  STAT* If patient is at the pharmacy, call can be transferred to refill team.   1. Which medications need to be refilled? (please list name of each medication and dose if known)  apixaban (ELIQUIS) 5 MG TABS tablet  2. Which pharmacy/location (including street and city if local pharmacy) is medication to be sent to? [facility] [address] [place] - [address]  3. Do they need a 30 day or 90 day supply? 90 day supply

## 2020-01-21 ENCOUNTER — Telehealth: Payer: Self-pay | Admitting: Cardiology

## 2020-01-21 NOTE — Telephone Encounter (Signed)
He is taking the appropriate dose of Eliquis based on his age, weight, and kidney function. The Eliquis is an effective blood thinner and he should not worry about his difficulty with the finger pricks.

## 2020-01-21 NOTE — Telephone Encounter (Signed)
Called patient, advised of message from PharmD.  Patient verbalized understanding.   

## 2020-01-21 NOTE — Telephone Encounter (Signed)
Patient is inquiring about the finger prick blood tests he's having to do for a Covid study. He states he seems to have a lot of trouble getting enough blood to complete the test.  Being on Eliquis, he thought that he should bleed easily. Patient is wanting to know if this normal and nothing to worry about. Please advise.

## 2020-01-21 NOTE — Telephone Encounter (Signed)
Any advice on this?

## 2020-01-28 ENCOUNTER — Other Ambulatory Visit: Payer: Self-pay

## 2020-01-28 ENCOUNTER — Ambulatory Visit
Admission: RE | Admit: 2020-01-28 | Discharge: 2020-01-28 | Disposition: A | Discharge status: Home / Self Care | Payer: Medicare Other | Source: Ambulatory Visit | Attending: Physician Assistant | Admitting: Physician Assistant

## 2020-01-28 ENCOUNTER — Other Ambulatory Visit: Payer: Self-pay | Admitting: Physician Assistant

## 2020-01-28 DIAGNOSIS — M25512 Pain in left shoulder: Secondary | ICD-10-CM

## 2020-04-02 ENCOUNTER — Telehealth: Payer: Self-pay | Admitting: Cardiology

## 2020-04-02 NOTE — Telephone Encounter (Signed)
Pt has appt 04/07/20 with Dr. Antoine Poche. I have added pre op clearance is needed to appt  Notes. Will fax clearance notes to MD for upcoming appt. Will send FYI to requesting office pt has appt 04/07/20.

## 2020-04-02 NOTE — Telephone Encounter (Signed)
° °  [facility] Pre-operative Risk Assessment     Request for surgical clearance:  1. What type of surgery is being performed? Left shoulder scope with RCR   2. When is this surgery scheduled? 04/28/2020   3. What type of clearance is required (medical clearance vs. Pharmacy clearance to hold med vs. Both)? Both   4. Are there any medications that need to be held prior to surgery and how long? Eliquis    5. Practice name and name of physician performing surgery? Dr. Justice Britain with [facility]   6. What is the office phone number? [phone]   7.   What is the office fax number? [phone] (attn: [place] Chard)  8.   Anesthesia type (None, local, MAC, general) ? General    Andre Parker M 04/02/2020, 2:51 PM  _________________________________________________________________   (provider comments below)

## 2020-04-02 NOTE — Telephone Encounter (Signed)
   Primary Cardiologist: Rollene Rotunda, MD  Chart reviewed as part of pre-operative protocol coverage. Because of Andre Parker past medical history and time since last visit, he will require a follow-up visit in order to better assess preoperative cardiovascular risk.  Pre-op covering staff: - Please schedule appointment and call patient to inform them. If patient already had an upcoming appointment within acceptable timeframe, please add "pre-op clearance" to the appointment notes so provider is aware. - Please contact requesting surgeon's office via preferred method (i.e, phone, fax) to inform them of need for appointment prior to surgery.  If applicable, this message will also be routed to pharmacy pool and/or primary cardiologist for input on holding anticoagulant/antiplatelet agent as requested below so that this information is available to the clearing provider at time of patient's appointment.    Pt has an appt on 04/07/20 with Dr. Antoine Poche. Please adjust appt notes.  Marcelino Duster, PA  04/02/2020, 4:46 PM

## 2020-04-06 NOTE — Progress Notes (Signed)
Cardiology Office Note   Date:  04/07/2020   ID:  Andre Parker, DOB 06-Mar-1946, MRN [id number]  PCP:  Roger Kill, PA-C  Cardiologist:   Rollene Rotunda, MD    Chief Complaint  Patient presents with  . Atrial Fibrillation      History of Present Illness: Andre Parker is a 73 y.o. male who presents for evaluation of atrial fib.  He has had a Holter with persistent atrial fib with good rate control.  His echo was normal.   Stress perfusion study was normal om 2020.  He is due to have a shoulder arthroscopy.    Since I last saw him he has done well.  He has not really noticed his atrial fibrillation.  He walks with his dog every day.  He goes up and down stairs. The patient denies any new symptoms such as chest discomfort, neck or arm discomfort. There has been no new shortness of breath, PND or orthopnea. There have been no reported palpitations, presyncope or syncope.    Past Medical History:  Diagnosis Date  . Atrial fibrillation (HCC)   . Low back pain     Past Surgical History:  Procedure Laterality Date  . LUMBAR LAMINECTOMY    . ROTATOR CUFF REPAIR Right   . TONSILLECTOMY    . VASECTOMY       Current Outpatient Medications  Medication Sig Dispense Refill  . acetaminophen (TYLENOL) 325 MG tablet Take 325 mg by mouth every 6 (six) hours as needed.    Marland Kitchen apixaban (ELIQUIS) 5 MG TABS tablet Take 1 tablet (5 mg total) by mouth 2 (two) times daily. 180 tablet 3  . Ascorbic Acid (VITAMIN C PO) Take 500 mg by mouth 2 (two) times daily.     . Calcium Citrate (CAL-CITRATE PO) Take 250 mg by mouth 3 (three) times daily.     . chlorpheniramine (CHLOR-TRIMETON) 4 MG tablet Take 4 mg by mouth as needed for allergies.    . Cholecalciferol (VITAMIN D3) 1000 units CAPS Take 1 capsule by mouth daily.     . ferrous sulfate 325 (65 FE) MG tablet Take 325 mg by mouth daily with breakfast.    . Magnesium 250 MG TABS Take 1 tablet by mouth daily.    . Melatonin-Pyridoxine  (MELATIN PO) Take by mouth.    . Multiple Vitamin (MULTIVITAMIN) tablet Take 1 tablet by mouth daily.     No current facility-administered medications for this visit.    Allergies:   Patient has no known allergies.    ROS:  Please see the history of present illness.   Otherwise, review of systems are positive none.   All other systems are reviewed and negative.    PHYSICAL EXAM: VS:  BP 115/85   Pulse 84   Temp (!) 97.2 F (36.2 C)   Ht 5\' 10"  (1.778 m)   Wt 166 lb (75.3 kg)   SpO2 97%   BMI 23.82 kg/m  , BMI Body mass index is 23.82 kg/m.  GENERAL:  Well appearing NECK:  No jugular venous distention, waveform within normal limits, carotid upstroke brisk and symmetric, no bruits, no thyromegaly LUNGS:  Clear to auscultation bilaterally CHEST:  Unremarkable HEART:  PMI not displaced or sustained,S1 and S2 within normal limits, no S3, no clicks, no rubs, no murmurs, irregular ABD:  Flat, positive bowel sounds normal in frequency in pitch, no bruits, no rebound, no guarding, no midline pulsatile mass, no hepatomegaly, no splenomegaly EXT:  2 plus pulses throughout, no edema, no cyanosis no clubbing   EKG:  EKG is  ordered today. Atrial fibrillation, right bundle branch block, rate 84, no change from previous.  Recent Labs: No results found for requested labs within last 8760 hours.    Lipid Panel No results found for: CHOL, TRIG, HDL, CHOLHDL, VLDL, LDLCALC, LDLDIRECT    Wt Readings from Last 3 Encounters:  04/07/20 166 lb (75.3 kg)  03/25/19 166 lb 3.2 oz (75.4 kg)  09/07/18 166 lb 6.4 oz (75.5 kg)      Other studies Reviewed: Additional studies/ records that were reviewed today include: Labs Review of the above records demonstrates:   See elsewhere   ASSESSMENT AND PLAN:   ATRIAL FIB:  Mr. Rockney Grenz has a CHA2DS2 - VASc score of  2.  The patient will continue on the meds as listed.  I will check a CBC as I do not see in his blood work that he had in the last  couple of years.  He has had renal function and he clarifies that he is on the dose appropriate of Eliquis.   PREOP: Patient is going to have shoulder arthroscopy.  He has no high risk cardiovascular contraindications.  We talked about holding his Eliquis few days prior.  He should resume this the day of the procedure if okay with the surgeon.    Current medicines are reviewed at length with the patient today.  The patient does not have concerns regarding medicines.  The following changes have been made:  None  Labs/ tests ordered today include:  None Orders Placed This Encounter  Procedures  . CBC  . EKG 12-Lead     Disposition:   FU with me in 12 months.    Signed, Rollene Rotunda, MD  04/07/2020 3:44 PM    [facility]

## 2020-04-07 ENCOUNTER — Other Ambulatory Visit: Payer: Self-pay

## 2020-04-07 ENCOUNTER — Ambulatory Visit (INDEPENDENT_AMBULATORY_CARE_PROVIDER_SITE_OTHER): Payer: Medicare Other | Admitting: Cardiology

## 2020-04-07 ENCOUNTER — Encounter: Payer: Self-pay | Admitting: Cardiology

## 2020-04-07 VITALS — BP 115/85 | HR 84 | Temp 97.2°F | Ht 70.0 in | Wt 166.0 lb

## 2020-04-07 DIAGNOSIS — I4821 Permanent atrial fibrillation: Secondary | ICD-10-CM

## 2020-04-07 DIAGNOSIS — Z01810 Encounter for preprocedural cardiovascular examination: Secondary | ICD-10-CM

## 2020-04-07 MED ORDER — APIXABAN 5 MG PO TABS: 5.0000 mg | ORAL_TABLET | Freq: Two times a day (BID) | ORAL | 3 refills | Status: DC

## 2020-04-07 NOTE — Patient Instructions (Addendum)
Medication Instructions:  No changes *If you need a refill on your cardiac medications before your next appointment, please call your pharmacy*   Lab Work: CBC in our lab today If you have labs (blood work) drawn today and your tests are completely normal, you will receive your results only by: Marland Kitchen MyChart Message (if you have MyChart) OR . A paper copy in the mail If you have any lab test that is abnormal or we need to change your treatment, we will call you to review the results.   Testing/Procedures: None ordered   Follow-Up: At [facility], you and your health needs are our priority.  As part of our continuing mission to provide you with exceptional heart care, we have created designated Provider Care Teams.  These Care Teams include your primary Cardiologist (physician) and Advanced Practice Providers (APPs -  Physician Assistants and Nurse Practitioners) who all work together to provide you with the care you need, when you need it.  We recommend signing up for the patient portal called "MyChart".  Sign up information is provided on this After Visit Summary.  MyChart is used to connect with patients for Virtual Visits (Telemedicine).  Patients are able to view lab/test results, encounter notes, upcoming appointments, etc.  Non-urgent messages can be sent to your provider as well.   To learn more about what you can do with MyChart, go to [email or web address].    Your next appointment:   12 month(s)  The format for your next appointment:   In Person  Provider:   Rollene Rotunda, MD   Other Instructions None

## 2020-04-08 LAB — CBC
Hematocrit: 49.9 % (ref 37.5–51.0)
Hemoglobin: 17.2 g/dL (ref 13.0–17.7)
MCH: 34.3 pg — ABNORMAL HIGH (ref 26.6–33.0)
MCHC: 34.5 g/dL (ref 31.5–35.7)
MCV: 100 fL — ABNORMAL HIGH (ref 79–97)
Platelets: 179 10*3/uL (ref 150–450)
RBC: 5.01 x10E6/uL (ref 4.14–5.80)
RDW: 12.1 % (ref 11.6–15.4)
WBC: 6 10*3/uL (ref 3.4–10.8)

## 2020-05-06 ENCOUNTER — Ambulatory Visit: Payer: Medicare Other | Attending: Orthopedic Surgery | Admitting: Physical Therapy

## 2020-05-06 ENCOUNTER — Encounter: Payer: Self-pay | Admitting: Physical Therapy

## 2020-05-06 ENCOUNTER — Other Ambulatory Visit: Payer: Self-pay

## 2020-05-06 DIAGNOSIS — M6281 Muscle weakness (generalized): Secondary | ICD-10-CM

## 2020-05-06 DIAGNOSIS — M25612 Stiffness of left shoulder, not elsewhere classified: Secondary | ICD-10-CM | POA: Diagnosis not present

## 2020-05-06 NOTE — Therapy (Signed)
[facility] [address] [place], [place], [address] Phone: [phone]   Fax:  [phone]  Physical Therapy Evaluation  Patient Details  Name: Andre Parker MRN: [id number] Date of Birth: 12-Nov-1945 Referring Provider (PT): Francena Hanly MD   Encounter Date: 05/06/2020   PT End of Session - 05/06/20 1109    Visit Number 1    Date for PT Re-Evaluation 07/29/20    Authorization Type U[employer] Medicare    Progress Note Due on Visit 10    PT Start Time 1110   Pt late   PT Stop Time 1140    PT Time Calculation (min) 30 min    Activity Tolerance Patient tolerated treatment well    Behavior During Therapy [facility] for tasks assessed/performed           Past Medical History:  Diagnosis Date  . Atrial fibrillation (HCC)   . Low back pain     Past Surgical History:  Procedure Laterality Date  . LUMBAR LAMINECTOMY    . ROTATOR CUFF REPAIR Right   . TONSILLECTOMY    . VASECTOMY      There were no vitals filed for this visit.    Subjective Assessment - 05/06/20 1110    Subjective Pt is 8 days s/p Lt shoulder RC repair and long head of bicep tendon repair by Dr. Rennis Chris.  Pt sufferred a fall approx 2.5 mos ago pulling roots out of drain and injured his shoulder.    Pertinent History Pt in Lt UE sling x 6 weeks with exception of exercise and bathing, maintain bend in Lt elbow for now due to bicep repair healing    Limitations Lifting;House hold activities    Patient Stated Goals return to PLOF (full Lt UE use)    Currently in Pain? No/denies   twinge on occassion, no meds             [facility] PT Assessment - 05/06/20 0001      Assessment   Medical Diagnosis Z98.890 (ICD-10-CM) - S/P rotator cuff repair    Referring Provider (PT) Francena Hanly MD    Onset Date/Surgical Date 04/28/20    Hand Dominance Right    Next MD Visit 06/01/20    Prior Therapy no      Precautions   Precautions Shoulder    Precaution Comments  Lt shoulder sling x 6 weeks other than for exercise and bathing, maintain bend in elbow for bicep repair      Restrictions   Other Position/Activity Restrictions no WB through Lt UE       Balance Screen   Has the patient fallen in the past 6 months Yes    How many times? 1    Has the patient had a decrease in activity level because of a fear of falling?  No    Is the patient reluctant to leave their home because of a fear of falling?  No      Home [employer] information [employer] manager residence    Living Arrangements Spouse/significant other;Other relatives    Type of Home House      Prior Function   Level of Independence Independent    Vocation Retired    Leisure fix things, Marine scientist   Overall Cognitive Status Within Functional Limits for tasks assessed      Observation/Other Assessments   Observations significant bruising along Lt bicep and anterior shoulder    Skin Integrity  good healing Lt shoulder arthrosopic incisions, need scar mobs    Focus on Therapeutic Outcomes (FOTO)  96% limited (goal 44%)      Sensation   Light Touch Appears Intact      Posture/Postural Control   Posture/Postural Control Postural limitations    Postural Limitations Increased thoracic kyphosis;Forward head;Rounded Shoulders      ROM / Strength   AROM / PROM / Strength AROM;Strength;PROM      AROM   Overall AROM Comments Rt UE WNL      PROM   PROM Assessment Site Shoulder    Right/Left Shoulder Left    Left Shoulder Flexion 120 Degrees   scaption   Left Shoulder Internal Rotation 15 Degrees    Left Shoulder External Rotation 15 Degrees      Strength   Overall Strength Comments Rt UE WNL throughout      Palpation   Palpation comment non-tender bicep and shoulder on Lt                      Objective measurements completed on examination: See above findings.       [facility] Adult PT Treatment/Exercise - 05/06/20 0001      Self-Care   Self-Care Scar  Mobilizations    Scar Mobilizations for HEP      Exercises   Exercises Shoulder      Shoulder Exercises: Standing   Other Standing Exercises reviewed pendulums from MD - good technique      Manual Therapy   Manual Therapy Passive ROM    Passive ROM Lt shoulder scaption and IR/ER in supine                    PT Short Term Goals - 05/06/20 1237      PT SHORT TERM GOAL #1   Title Pt will report good pain control not to exceed 3/10 with Phase 1 P/ROM    Time 4    Period Weeks    Status New    Target Date 06/03/20      PT SHORT TERM GOAL #2   Title Pt will adhere to all precautions and sling use to promote healing of Lt shoulder.    Time 4    Period Weeks    Status New    Target Date 06/03/20      PT SHORT TERM GOAL #3   Title Pt will achieve Rt shoulder P/ROM of at least 120 deg scaption, and 45 deg IR/ER in scapular plane to prep for AA/ROM.    Time 4    Period Weeks    Status New    Target Date 06/03/20      PT SHORT TERM GOAL #4   Title Pt will wean from sling (MD directed) and advance to AA/ROM with min pain not to exceed 3/10    Time 4    Period Weeks    Status New    Target Date 06/03/20             PT Long Term Goals - 05/06/20 1240      PT LONG TERM GOAL #1   Title Pt will achieve AA/ROM of Rt shoulder to at least 120 deg flexion, 90 deg abd, and 60 deg ER for improved UE functional use    Time 8    Period Weeks    Status New    Target Date 07/01/20      PT LONG TERM GOAL #2  Title Pt will achieve scapular stabilizer strength of at least 4/5 for good shoulder mechanics with reaching    Time 10    Period Weeks    Status New    Target Date 07/15/20      PT LONG TERM GOAL #3   Title Pt will achieve at least 4/5 strength of Rt shoulder cardinal planes for improved UE use with ADLs.    Time 12    Period Weeks    Status New    Target Date 07/29/20      PT LONG TERM GOAL #4   Title Pt will reduce FOTO to </= 44% to demo less limitation     Baseline 96%    Time 12    Period Weeks    Status New    Target Date 07/29/20      PT LONG TERM GOAL #5   Title Pt will achieve functional shoulder extension for grooming and dressing with min or no pain    Time 12    Period Weeks    Status New    Target Date 07/29/20                  Plan - 05/06/20 1148    Clinical Impression Statement Pt is a healthy, active 74yo male who is 8 days s/p Lt rotator cuff and long head of bicep repair by Dr. Rennis Chris on 11/16.  Protocol and MD referral calls for P/ROM 1x/week x 4 weeks.  Pt has f/u with MD on 06/01/20.  Pt is wearing sling full time with exception of exercise (pendulums only) and bathing.  Pt has no pain other than occassional twinges and isn't taking any medications for his shoulder at this time.  He has signif bruising along anterior shoulder and bicep.  Arthorscopic incisions are well healed and PT initiated and instructed Pt in scar mobs for these.  Pt is doing pendulums properly and has been instructed to keep elbow bent with these for bicep tendon healing.  Pt had no pain with P/ROM today, and easily reaches 120 scaption and 15 deg each for IR/ER in scapular plane.  FOTO score is 96% with goal of 44%.  Pt will benefit from skilled PT along protocol for improved range, strength and functional use of Lt UE.    Examination-Activity Limitations Lift;Reach Overhead;Bathing;Transfers;Carry;Dressing    Examination-Participation Restrictions Cleaning;Yard Work;Shop;Laundry;Community Activity;Meal Prep    Stability/Clinical Decision Making Stable/Uncomplicated    Clinical Decision Making Low    Rehab Potential Excellent    PT Frequency 2x / week    PT Duration 12 weeks   1x/week x 4 weeks, then 2x/week if MD agrees   PT Treatment/Interventions ADLs/Self Care Home Management;Cryotherapy;Electrical Stimulation;Moist Heat;Neuromuscular re-education;Therapeutic exercise;Therapeutic activities;Functional mobility training;Patient/family  education;Manual techniques;Passive range of motion;Joint Manipulations;Spinal Manipulations    PT Next Visit Plan P/ROM only, f/u on scar massage    PT Home Exercise Plan Pt already has pendulum ther ex from MD    Consulted and Agree with Plan of Care Patient           Patient will benefit from skilled therapeutic intervention in order to improve the following deficits and impairments:  Decreased range of motion, Postural dysfunction, Decreased strength, Impaired UE functional use, Hypomobility, Decreased mobility  Visit Diagnosis: Stiffness of left shoulder, not elsewhere classified - Plan: PT plan of care cert/re-cert  Muscle weakness (generalized) - Plan: PT plan of care cert/re-cert     Problem List Patient Active Problem List  Diagnosis Date Noted  . SOB (shortness of breath) 09/09/2018  . Atrial fibrillation, chronic (HCC) 07/23/2018  . Pain in right knee 04/24/2018  . Medication management 04/18/2018  . Permanent atrial fibrillation (HCC) 10/10/2016  . Persistent atrial fibrillation (HCC) 09/11/2016  . RBBB 09/11/2016  . Need for vaccine for DT (diphtheria-tetanus) 01/25/2016  . Pain of left heel 11/25/2015  . Rib pain on right side 11/25/2015  . Compression fracture of L1 lumbar vertebra (HCC) 11/24/2015  . Gilbert's syndrome 11/24/2015  . Lower back pain 11/24/2015  . 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 Arthralgia 11/08/2011  . CALF PAIN, LEFT 07/16/2008  . MUSCLE STRAIN, LEFT CALF 07/16/2008    Morton Peters, PT 05/06/20 12:44 PM   [facility] [address], [address] [place], [place], [address] Phone: [phone]   Fax:  [phone]  Name: Andre Parker MRN: [id number] Date of Birth: 1946/04/06

## 2020-05-13 ENCOUNTER — Encounter: Payer: Self-pay | Admitting: Physical Therapy

## 2020-05-13 ENCOUNTER — Ambulatory Visit: Payer: Medicare Other | Attending: Orthopedic Surgery | Admitting: Physical Therapy

## 2020-05-13 ENCOUNTER — Other Ambulatory Visit: Payer: Self-pay

## 2020-05-13 DIAGNOSIS — M25612 Stiffness of left shoulder, not elsewhere classified: Secondary | ICD-10-CM | POA: Insufficient documentation

## 2020-05-13 DIAGNOSIS — M6281 Muscle weakness (generalized): Secondary | ICD-10-CM | POA: Insufficient documentation

## 2020-05-13 NOTE — Therapy (Signed)
[facility] [address], [address] [place], [place], [address] Phone: [phone]   Fax:  [phone]  Physical Therapy Treatment  Patient Details  Name: Andre Parker MRN: [id number] Date of Birth: May 14, 1946 Referring Provider (PT): Francena Hanly MD   Encounter Date: 05/13/2020   PT End of Session - 05/13/20 0932    Visit Number 2    Date for PT Re-Evaluation 07/29/20    Authorization Type UHC Medicare    Progress Note Due on Visit 10    PT Start Time 0930    PT Stop Time 1008    PT Time Calculation (min) 38 min    Activity Tolerance Patient tolerated treatment well    Behavior During Therapy [facility] for tasks assessed/performed           Past Medical History:  Diagnosis Date  . Atrial fibrillation (HCC)   . Low back pain     Past Surgical History:  Procedure Laterality Date  . LUMBAR LAMINECTOMY    . ROTATOR CUFF REPAIR Right   . TONSILLECTOMY    . VASECTOMY      There were no vitals filed for this visit.   Subjective Assessment - 05/13/20 0931    Subjective No pain, just an osscaional twinge. Doing ok.    Pertinent History Pt in Lt UE sling x 6 weeks with exception of exercise and bathing, maintain bend in Lt elbow for now due to bicep repair healing    Multiple Pain Sites No              [facility] PT Assessment - 05/13/20 0001      PROM   Left Shoulder Flexion 140 Degrees    Left Shoulder External Rotation 27 Degrees                         [facility] Adult PT Treatment/Exercise - 05/13/20 0001      Manual Therapy   Manual Therapy Soft tissue mobilization;Passive ROM    Soft tissue mobilization Bicep and lateral deltoid, pec release    Passive ROM Lt shlouder flex.scaption, ER                    PT Short Term Goals - 05/13/20 0948      PT SHORT TERM GOAL #1   Title Pt will report good pain control not to exceed 3/10 with Phase 1 P/ROM    Time 4    Period Weeks    Status  Achieved    Target Date 06/03/20      PT SHORT TERM GOAL #2   Title Pt will adhere to all precautions and sling use to promote healing of Lt shoulder.    Time 4    Status Achieved             PT Long Term Goals - 05/06/20 1240      PT LONG TERM GOAL #1   Title Pt will achieve AA/ROM of Rt shoulder to at least 120 deg flexion, 90 deg abd, and 60 deg ER for improved UE functional use    Time 8    Period Weeks    Status New    Target Date 07/01/20      PT LONG TERM GOAL #2   Title Pt will achieve scapular stabilizer strength of at least 4/5 for good shoulder mechanics with reaching    Time 10    Period Weeks  Status New    Target Date 07/15/20      PT LONG TERM GOAL #3   Title Pt will achieve at least 4/5 strength of Rt shoulder cardinal planes for improved UE use with ADLs.    Time 12    Period Weeks    Status New    Target Date 07/29/20      PT LONG TERM GOAL #4   Title Pt will reduce FOTO to </= 44% to demo less limitation    Baseline 96%    Time 12    Period Weeks    Status New    Target Date 07/29/20      PT LONG TERM GOAL #5   Title Pt will achieve functional shoulder extension for grooming and dressing with min or no pain    Time 12    Period Weeks    Status New    Target Date 07/29/20                 Plan - 05/13/20 0956    Clinical Impression Statement Pt is independent and compliant with HEP. Minor trigger points in bicep. PROM flexion/scaption and external rotation all improved since initial eval. No pain with PROM.    Examination-Activity Limitations Lift;Reach Overhead;Bathing;Transfers;Carry;Dressing    Examination-Participation Restrictions Cleaning;Yard Work;Shop;Laundry;Community Activity;Meal Prep    Stability/Clinical Decision Making Stable/Uncomplicated    Rehab Potential Excellent    PT Frequency 2x / week    PT Duration 12 weeks    PT Treatment/Interventions ADLs/Self Care Home Management;Cryotherapy;Electrical Stimulation;Moist  Heat;Neuromuscular re-education;Therapeutic exercise;Therapeutic activities;Functional mobility training;Patient/family education;Manual techniques;Passive range of motion;Joint Manipulations;Spinal Manipulations    PT Next Visit Plan P/ROM only, f/u on scar massage    PT Home Exercise Plan Pt already has pendulum ther ex from MD    Consulted and Agree with Plan of Care Patient           Patient will benefit from skilled therapeutic intervention in order to improve the following deficits and impairments:  Decreased range of motion, Postural dysfunction, Decreased strength, Impaired UE functional use, Hypomobility, Decreased mobility  Visit Diagnosis: Stiffness of left shoulder, not elsewhere classified  Muscle weakness (generalized)     Problem List Patient Active Problem List   Diagnosis Date Noted  . SOB (shortness of breath) 09/09/2018  . Atrial fibrillation, chronic (HCC) 07/23/2018  . Pain in right knee 04/24/2018  . Medication management 04/18/2018  . Permanent atrial fibrillation (HCC) 10/10/2016  . Persistent atrial fibrillation (HCC) 09/11/2016  . RBBB 09/11/2016  . Need for vaccine for DT (diphtheria-tetanus) 01/25/2016  . Pain of left heel 11/25/2015  . Rib pain on right side 11/25/2015  . Compression fracture of L1 lumbar vertebra (HCC) 11/24/2015  . Gilbert's syndrome 11/24/2015  . Lower back pain 11/24/2015  . 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 Arthralgia 11/08/2011  . CALF PAIN, LEFT 07/16/2008  . MUSCLE STRAIN, LEFT CALF 07/16/2008    Victor Langenbach, PTA 05/13/2020, 10:07 AM  [facility] [address], [address] [place], [place], [address] Phone: [phone]   Fax:  [phone]  Name: Andre Parker MRN: [id number] Date of Birth: 1945-06-23

## 2020-05-18 ENCOUNTER — Encounter: Payer: Medicare Other | Admitting: Physical Therapy

## 2020-05-20 ENCOUNTER — Other Ambulatory Visit: Payer: Self-pay

## 2020-05-20 ENCOUNTER — Ambulatory Visit: Payer: Medicare Other | Admitting: Physical Therapy

## 2020-05-20 ENCOUNTER — Encounter: Payer: Self-pay | Admitting: Physical Therapy

## 2020-05-20 DIAGNOSIS — M25612 Stiffness of left shoulder, not elsewhere classified: Secondary | ICD-10-CM | POA: Diagnosis not present

## 2020-05-20 DIAGNOSIS — M6281 Muscle weakness (generalized): Secondary | ICD-10-CM

## 2020-05-20 NOTE — Therapy (Signed)
[facility] [address] [place], [place], [address] Phone: [phone]   Fax:  [phone]  Physical Therapy Treatment  Patient Details  Name: Andre Parker MRN: [id number] Date of Birth: 1945-09-11 Referring Provider (PT): Francena Hanly MD   Encounter Date: 05/20/2020   PT End of Session - 05/20/20 1155    Visit Number 3    Date for PT Re-Evaluation 07/29/20    Authorization Type UHC Medicare    Progress Note Due on Visit 10    PT Start Time 1153   pt late   PT Stop Time 1226    PT Time Calculation (min) 33 min    Activity Tolerance Patient tolerated treatment well    Behavior During Therapy [facility] for tasks assessed/performed           Past Medical History:  Diagnosis Date  . Atrial fibrillation (HCC)   . Low back pain     Past Surgical History:  Procedure Laterality Date  . LUMBAR LAMINECTOMY    . ROTATOR CUFF REPAIR Right   . TONSILLECTOMY    . VASECTOMY      There were no vitals filed for this visit.   Subjective Assessment - 05/20/20 1157    Subjective No complaints, remains with an occasional twinge, Pec is tight    Pertinent History Pt in Lt UE sling x 6 weeks with exception of exercise and bathing, maintain bend in Lt elbow for now due to bicep repair healing    Currently in Pain? No/denies    Multiple Pain Sites No              [facility] PT Assessment - 05/20/20 0001      PROM   PROM Assessment Site Shoulder    Right/Left Shoulder Left    Left Shoulder Flexion 150 Degrees    Left Shoulder External Rotation 45 Degrees                         [facility] Adult PT Treatment/Exercise - 05/20/20 0001      Manual Therapy   Manual Therapy Soft tissue mobilization;Passive ROM    Soft tissue mobilization Bicep and lateral deltoid, pec release    Passive ROM Lt shlouder flex.scaption, ER                    PT Short Term Goals - 05/13/20 0948      PT SHORT TERM GOAL #1    Title Pt will report good pain control not to exceed 3/10 with Phase 1 P/ROM    Time 4    Period Weeks    Status Achieved    Target Date 06/03/20      PT SHORT TERM GOAL #2   Title Pt will adhere to all precautions and sling use to promote healing of Lt shoulder.    Time 4    Status Achieved             PT Long Term Goals - 05/06/20 1240      PT LONG TERM GOAL #1   Title Pt will achieve AA/ROM of Rt shoulder to at least 120 deg flexion, 90 deg abd, and 60 deg ER for improved UE functional use    Time 8    Period Weeks    Status New    Target Date 07/01/20      PT LONG TERM GOAL #2   Title Pt will achieve  scapular stabilizer strength of at least 4/5 for good shoulder mechanics with reaching    Time 10    Period Weeks    Status New    Target Date 07/15/20      PT LONG TERM GOAL #3   Title Pt will achieve at least 4/5 strength of Rt shoulder cardinal planes for improved UE use with ADLs.    Time 12    Period Weeks    Status New    Target Date 07/29/20      PT LONG TERM GOAL #4   Title Pt will reduce FOTO to </= 44% to demo less limitation    Baseline 96%    Time 12    Period Weeks    Status New    Target Date 07/29/20      PT LONG TERM GOAL #5   Title Pt will achieve functional shoulder extension for grooming and dressing with min or no pain    Time 12    Period Weeks    Status New    Target Date 07/29/20                 Plan - 05/20/20 1156    Clinical Impression Statement Pt not having much pain to speak of and PROM continues to improve week to week.    Examination-Activity Limitations Lift;Reach Overhead;Bathing;Transfers;Carry;Dressing    Examination-Participation Restrictions Cleaning;Yard Work;Shop;Laundry;Community Activity;Meal Prep    Stability/Clinical Decision Making Stable/Uncomplicated    Rehab Potential Excellent    PT Frequency 2x / week    PT Duration 12 weeks    PT Treatment/Interventions ADLs/Self Care Home  Management;Cryotherapy;Electrical Stimulation;Moist Heat;Neuromuscular re-education;Therapeutic exercise;Therapeutic activities;Functional mobility training;Patient/family education;Manual techniques;Passive range of motion;Joint Manipulations;Spinal Manipulations    PT Next Visit Plan P/ROM only, soft tissue to Lt pec    PT Home Exercise Plan Pt already has pendulum ther ex from MD    Consulted and Agree with Plan of Care Patient           Patient will benefit from skilled therapeutic intervention in order to improve the following deficits and impairments:  Decreased range of motion, Postural dysfunction, Decreased strength, Impaired UE functional use, Hypomobility, Decreased mobility  Visit Diagnosis: Stiffness of left shoulder, not elsewhere classified  Muscle weakness (generalized)     Problem List Patient Active Problem List   Diagnosis Date Noted  . SOB (shortness of breath) 09/09/2018  . Atrial fibrillation, chronic (HCC) 07/23/2018  . Pain in right knee 04/24/2018  . Medication management 04/18/2018  . Permanent atrial fibrillation (HCC) 10/10/2016  . Persistent atrial fibrillation (HCC) 09/11/2016  . RBBB 09/11/2016  . Need for vaccine for DT (diphtheria-tetanus) 01/25/2016  . Pain of left heel 11/25/2015  . Rib pain on right side 11/25/2015  . Compression fracture of L1 lumbar vertebra (HCC) 11/24/2015  . Gilbert's syndrome 11/24/2015  . Lower back pain 11/24/2015  . Osteoarthritis of carpometacarpal [facility] joint of left thumb 11/24/2015  . Arthralgia 11/08/2011  . CALF PAIN, LEFT 07/16/2008  . MUSCLE STRAIN, LEFT CALF 07/16/2008    Waleska Buttery, PTA 05/20/2020, 12:26 PM  [facility] [address], [address] [place], [place], [address] Phone: [phone]   Fax:  [phone]  Name: Andre Parker MRN: [id number] Date of Birth: Jun 07, 1946

## 2020-05-27 ENCOUNTER — Encounter: Payer: Self-pay | Admitting: Physical Therapy

## 2020-05-27 ENCOUNTER — Ambulatory Visit: Payer: Medicare Other | Admitting: Physical Therapy

## 2020-05-27 ENCOUNTER — Other Ambulatory Visit: Payer: Self-pay

## 2020-05-27 DIAGNOSIS — M25612 Stiffness of left shoulder, not elsewhere classified: Secondary | ICD-10-CM | POA: Diagnosis not present

## 2020-05-27 DIAGNOSIS — M6281 Muscle weakness (generalized): Secondary | ICD-10-CM

## 2020-05-27 NOTE — Therapy (Signed)
[facility] [address], [place], [place], [address] Phone: [phone]   Fax:  [phone]  Physical Therapy Treatment  Patient Details  Name: Andre Parker MRN: [id number] Date of Birth: [birth year] Referring Provider (PT): Justice Britain MD   Encounter Date: 05/27/2020   PT End of Session - 05/27/20 1109    Visit Number 4    Date for PT Re-Evaluation 07/29/20    Authorization Type UHC Medicare    Progress Note Due on Visit 10    PT Start Time 1106    PT Stop Time 1136    PT Time Calculation (min) 30 min    Activity Tolerance Patient tolerated treatment well    Behavior During Therapy [facility] for tasks assessed/performed           Past Medical History:  Diagnosis Date  . Atrial fibrillation ([place])   . Low back pain     Past Surgical History:  Procedure Laterality Date  . LUMBAR LAMINECTOMY    . ROTATOR CUFF REPAIR Right   . TONSILLECTOMY    . VASECTOMY      There were no vitals filed for this visit.   Subjective Assessment - 05/27/20 1110    Subjective No complaints, wearing the sling less ofen around the house.    Currently in Pain? No/denies    Multiple Pain Sites No              [facility] PT Assessment - 05/27/20 0001      PROM   Right/Left Shoulder Left    Left Shoulder Flexion 150 Degrees    Left Shoulder External Rotation 55 Degrees   arm by side                        [facility] Adult PT Treatment/Exercise - 05/27/20 0001      Manual Therapy   Manual Therapy Soft tissue mobilization;Passive ROM    Soft tissue mobilization Bicep and lateral deltoid, pec release    Passive ROM Lt shlouder flex.scaption, ER                    PT Short Term Goals - 05/27/20 1128      PT SHORT TERM GOAL #3   Title Pt will achieve Rt shoulder P/ROM of at least 120 deg scaption, and 45 deg IR/ER in scapular plane to prep for AA/ROM.    Time 4    Period Weeks    Status Achieved     Target Date 06/03/20             PT Long Term Goals - 05/06/20 1240      PT LONG TERM GOAL #1   Title Pt will achieve AA/ROM of Rt shoulder to at least 120 deg flexion, 90 deg abd, and 60 deg ER for improved UE functional use    Time 8    Period Weeks    Status New    Target Date 07/01/20      PT LONG TERM GOAL #2   Title Pt will achieve scapular stabilizer strength of at least 4/5 for good shoulder mechanics with reaching    Time 10    Period Weeks    Status New    Target Date 07/15/20      PT LONG TERM GOAL #3   Title Pt will achieve at least 4/5 strength of Rt shoulder cardinal planes for improved UE use with ADLs.  Time 12    Period Weeks    Status New    Target Date 07/29/20      PT LONG TERM GOAL #4   Title Pt will reduce FOTO to </= 44% to demo less limitation    Baseline 96%    Time 12    Period Weeks    Status New    Target Date 07/29/20      PT LONG TERM GOAL #5   Title Pt will achieve functional shoulder extension for grooming and dressing with min or no pain    Time 12    Period Weeks    Status New    Target Date 07/29/20                 Plan - 05/27/20 1109    Clinical Impression Statement All short term goals met, PROM continues to progress with no pain. Pt is slowly weaning off his sling while at home.    Examination-Activity Limitations Lift;Reach Overhead;Bathing;Transfers;Carry;Dressing    Examination-Participation Restrictions Cleaning;Yard Work;Shop;Laundry;Community Activity;Meal Prep    Stability/Clinical Decision Making Stable/Uncomplicated    Rehab Potential Excellent    PT Frequency 2x / week    PT Duration 12 weeks    PT Treatment/Interventions ADLs/Self Care Home Management;Cryotherapy;Electrical Stimulation;Moist Heat;Neuromuscular re-education;Therapeutic exercise;Therapeutic activities;Functional mobility training;Patient/family education;Manual techniques;Passive range of motion;Joint Manipulations;Spinal Manipulations     PT Next Visit Plan To MD, send note to MD and follow up if pt allowed to progress to [facility].    PT Home Exercise Plan Pt already has pendulum ther ex from MD    Consulted and Agree with Plan of Care Patient           Patient will benefit from skilled therapeutic intervention in order to improve the following deficits and impairments:  Decreased range of motion,Postural dysfunction,Decreased strength,Impaired UE functional use,Hypomobility,Decreased mobility  Visit Diagnosis: Stiffness of left shoulder, not elsewhere classified  Muscle weakness (generalized)     Problem List Patient Active Problem List   Diagnosis Date Noted  . SOB (shortness of breath) 09/09/2018  . Atrial fibrillation, chronic ([place]) 07/23/2018  . Pain in right knee 04/24/2018  . Medication management 04/18/2018  . Permanent atrial fibrillation ([place]) 10/10/2016  . Persistent atrial fibrillation ([place]) 09/11/2016  . RBBB 09/11/2016  . Need for vaccine for DT (diphtheria-tetanus) 01/25/2016  . Pain of left heel 11/25/2015  . Rib pain on right side 11/25/2015  . Compression fracture of L1 lumbar vertebra ([place]) 11/24/2015  . Gilbert's syndrome 11/24/2015  . Lower back pain 11/24/2015  . 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 Arthralgia 11/08/2011  . CALF PAIN, LEFT 07/16/2008  . MUSCLE STRAIN, LEFT CALF 07/16/2008    Vaida Kerchner, PTA 05/27/2020, 11:41 AM  [facility] [address] [place], [place], [address] Phone: [phone]   Fax:  [phone]  Name: Andre Parker MRN: [id number] Date of Birth: [birth year]

## 2020-06-01 ENCOUNTER — Ambulatory Visit: Payer: Medicare Other | Admitting: Physical Therapy

## 2020-06-01 ENCOUNTER — Encounter: Payer: Self-pay | Admitting: Physical Therapy

## 2020-06-01 ENCOUNTER — Other Ambulatory Visit: Payer: Self-pay

## 2020-06-01 DIAGNOSIS — M25612 Stiffness of left shoulder, not elsewhere classified: Secondary | ICD-10-CM | POA: Diagnosis not present

## 2020-06-01 DIAGNOSIS — M6281 Muscle weakness (generalized): Secondary | ICD-10-CM

## 2020-06-01 NOTE — Therapy (Signed)
[facility] [address], [address] [place], [place], [address] Phone: [phone]   Fax:  [phone]  Physical Therapy Treatment  Patient Details  Name: Andre Parker MRN: [id number] Date of Birth: 01-16-1946 Referring Provider (PT): Francena Hanly MD   Encounter Date: 06/01/2020   PT End of Session - 06/01/20 1452    Visit Number 5    Date for PT Re-Evaluation 07/29/20    Authorization Type UHC Medicare    Progress Note Due on Visit 10    PT Start Time 1449    PT Stop Time 1520    PT Time Calculation (min) 31 min    Activity Tolerance Patient tolerated treatment well    Behavior During Therapy [facility] for tasks assessed/performed           Past Medical History:  Diagnosis Date  . Atrial fibrillation ([facility])   . Low back pain     Past Surgical History:  Procedure Laterality Date  . LUMBAR LAMINECTOMY    . ROTATOR CUFF REPAIR Right   . TONSILLECTOMY    . VASECTOMY      There were no vitals filed for this visit.   Subjective Assessment - 06/01/20 1453    Subjective Had to cancel MD appt and was rescheduled to this Wednesday.    Pertinent History Pt in Lt UE sling x 6 weeks with exception of exercise and bathing, maintain bend in Lt elbow for now due to bicep repair healing    Currently in Pain? No/denies    Multiple Pain Sites No                             [facility] Adult PT Treatment/Exercise - 06/01/20 0001      Manual Therapy   Manual Therapy Soft tissue mobilization;Passive ROM    Joint Mobilization Gd 2, 3 Lt shoulder posterior mobs    Soft tissue mobilization Bicep and lateral deltoid, pec release    Passive ROM Lt shlouder flex.scaption, ER                    PT Short Term Goals - 05/27/20 1128      PT SHORT TERM GOAL #3   Title Pt will achieve Rt shoulder P/ROM of at least 120 deg scaption, and 45 deg IR/ER in scapular plane to prep for AA/ROM.    Time 4    Period Weeks     Status Achieved    Target Date 06/03/20             PT Long Term Goals - 05/06/20 1240      PT LONG TERM GOAL #1   Title Pt will achieve AA/ROM of Rt shoulder to at least 120 deg flexion, 90 deg abd, and 60 deg ER for improved UE functional use    Time 8    Period Weeks    Status New    Target Date 07/01/20      PT LONG TERM GOAL #2   Title Pt will achieve scapular stabilizer strength of at least 4/5 for good shoulder mechanics with reaching    Time 10    Period Weeks    Status New    Target Date 07/15/20      PT LONG TERM GOAL #3   Title Pt will achieve at least 4/5 strength of Rt shoulder cardinal planes for improved UE use with ADLs.    Time  12    Period Weeks    Status New    Target Date 07/29/20      PT LONG TERM GOAL #4   Title Pt will reduce FOTO to </= 44% to demo less limitation    Baseline 96%    Time 12    Period Weeks    Status New    Target Date 07/29/20      PT LONG TERM GOAL #5   Title Pt will achieve functional shoulder extension for grooming and dressing with min or no pain    Time 12    Period Weeks    Status New    Target Date 07/29/20                 Plan - 06/01/20 1452    Clinical Impression Statement Pt had to reschedule his MD appointment to Wednesday of this week. PROM remains excellent and we anticiapte no issues moving into the next phase of shoulder protocol.    Examination-Activity Limitations Lift;Reach Overhead;Bathing;Transfers;Carry;Dressing    Examination-Participation Restrictions Cleaning;Yard Work;Shop;Laundry;Community Activity;Meal Prep    Stability/Clinical Decision Making Stable/Uncomplicated    Rehab Potential Excellent    PT Frequency 2x / week    PT Duration 12 weeks    PT Treatment/Interventions ADLs/Self Care Home Management;Cryotherapy;Electrical Stimulation;Moist Heat;Neuromuscular re-education;Therapeutic exercise;Therapeutic activities;Functional mobility training;Patient/family education;Manual  techniques;Passive range of motion;Joint Manipulations;Spinal Manipulations    PT Next Visit Plan To MD this Wednesday    PT Home Exercise Plan Pt already has pendulum ther ex from MD    Consulted and Agree with Plan of Care Patient           Patient will benefit from skilled therapeutic intervention in order to improve the following deficits and impairments:  Decreased range of motion,Postural dysfunction,Decreased strength,Impaired UE functional use,Hypomobility,Decreased mobility  Visit Diagnosis: Stiffness of left shoulder, not elsewhere classified  Muscle weakness (generalized)     Problem List Patient Active Problem List   Diagnosis Date Noted  . SOB (shortness of breath) 09/09/2018  . Atrial fibrillation, chronic (H[facility]) 07/23/2018  . Pain in right knee 04/24/2018  . Medication management 04/18/2018  . Permanent atrial fibrillation (HCC) 10/10/2016  . Persistent atrial fibrillation ([facility]) 09/11/2016  . RBBB 09/11/2016  . Need for vaccine for DT (diphtheria-tetanus) 01/25/2016  . Pain of left heel 11/25/2015  . Rib pain on right side 11/25/2015  . Compression fracture of L1 lumbar vertebra ([facility]) 11/24/2015  . Gilbert's syndrome 11/24/2015  . Lower back pain 11/24/2015  . 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 Arthralgia 11/08/2011  . CALF PAIN, LEFT 07/16/2008  . MUSCLE STRAIN, LEFT CALF 07/16/2008    Andre Parker, PTA 06/01/2020, 3:19 PM  [facility] [address], [address] [place], [place], [address] Phone: [phone]   Fax:  [phone]  Name: Andre Parker MRN: [id number] Date of Birth: 10-03-1945

## 2020-06-08 ENCOUNTER — Encounter: Payer: Medicare Other | Admitting: Physical Therapy

## 2020-06-09 ENCOUNTER — Encounter: Payer: Self-pay | Admitting: Physical Therapy

## 2020-06-09 ENCOUNTER — Ambulatory Visit: Payer: Medicare Other | Admitting: Physical Therapy

## 2020-06-09 ENCOUNTER — Other Ambulatory Visit: Payer: Self-pay

## 2020-06-09 DIAGNOSIS — M25612 Stiffness of left shoulder, not elsewhere classified: Secondary | ICD-10-CM | POA: Diagnosis not present

## 2020-06-09 DIAGNOSIS — M6281 Muscle weakness (generalized): Secondary | ICD-10-CM

## 2020-06-09 NOTE — Patient Instructions (Signed)
Access Code: [email or web address] URL: [email or web address] Date: 06/09/2020 Prepared by: Loistine Simas Kylian Loh  Exercises Standing Shoulder Flexion Wall Walk - 2 x daily - 7 x weekly - 1 sets - 10 reps Standing Shoulder Alphabet - 2 x daily - 7 x weekly - 1 sets - 1 reps Seated Shoulder Flexion Slide at Table Top with Forearm in Neutral - 2 x daily - 7 x weekly - 1 sets - 10 reps Seated Shoulder Scaption Slide at Table Top with Forearm in Neutral - 2 x daily - 7 x weekly - 1 sets - 10 reps Supine Shoulder Flexion Extension AAROM with Dowel - 2 x daily - 7 x weekly - 1 sets - 10 reps Prone Shoulder Row - 1 x daily - 7 x weekly - 1 sets - 10 reps Prone Shoulder Extension - Single Arm - 1 x daily - 7 x weekly - 1 sets - 10 reps

## 2020-06-09 NOTE — Therapy (Signed)
[facility] [address], [address] [place], [place], [address] Phone: [phone]   Fax:  [phone]  Physical Therapy Treatment  Patient Details  Name: Andre Parker MRN: [id number] Date of Birth: [birth year] Referring Provider (PT): Francena Hanly MD   Encounter Date: 06/09/2020   PT End of Session - 06/09/20 1613    Visit Number 6    Date for PT Re-Evaluation 07/29/20    Authorization Type UHC Medicare    Progress Note Due on Visit 10    PT Start Time 1530    PT Stop Time 1613    PT Time Calculation (min) 43 min    Activity Tolerance Patient tolerated treatment well    Behavior During Therapy [facility] for tasks assessed/performed           Past Medical History:  Diagnosis Date  . Atrial fibrillation (HCC)   . Low back pain     Past Surgical History:  Procedure Laterality Date  . LUMBAR LAMINECTOMY    . ROTATOR CUFF REPAIR Right   . TONSILLECTOMY    . VASECTOMY      There were no vitals filed for this visit.   Subjective Assessment - 06/09/20 1535    Subjective Pt saw MD and has been cleared to move into Phase 2 AA/ROM Phase.  He is not using Lt brace at home but uses in the community for protection.    Pertinent History Pt in Lt UE sling x 6 weeks with exception of exercise and bathing, maintain bend in Lt elbow for now due to bicep repair healing    Limitations Lifting;House hold activities    Patient Stated Goals return to PLOF (full Lt UE use)    Currently in Pain? No/denies                             [facility] Adult PT Treatment/Exercise - 06/09/20 0001      Exercises   Exercises Shoulder;Elbow      Shoulder Exercises: Supine   Flexion AAROM;Both;15 reps    Flexion Limitations dowel      Shoulder Exercises: Seated   Extension --    Row --    Other Seated Exercises seated table slides flexion and scaption alternating x 10 each      Shoulder Exercises: Prone   Extension Left;AROM;10  reps    Other Prone Exercises Lt UE row A/ROM off edge of table x 10 reps      Shoulder Exercises: Pulleys   Flexion 2 minutes    Scaption 2 minutes      Shoulder Exercises: ROM/Strengthening   UBE (Upper Arm Bike) L1 1.5' each way, PT present to monitor for pain    Other ROM/Strengthening Exercises ball on table ABCs 1x, standing    Other ROM/Strengthening Exercises finger ladder x 5 reps                  PT Education - 06/09/20 1606    Education Details Access Code: [email or web address] - AA/ROM phase updates    Person(s) Educated Patient    Methods Explanation;Handout;Demonstration    Comprehension Verbalized understanding;Returned demonstration            PT Short Term Goals - 05/27/20 1128      PT SHORT TERM GOAL #3   Title Pt will achieve Rt shoulder P/ROM of at least 120 deg scaption, and 45 deg IR/ER in scapular plane to prep  for AA/ROM.    Time 4    Period Weeks    Status Achieved    Target Date 06/03/20             PT Long Term Goals - 05/06/20 1240      PT LONG TERM GOAL #1   Title Pt will achieve AA/ROM of Rt shoulder to at least 120 deg flexion, 90 deg abd, and 60 deg ER for improved UE functional use    Time 8    Period Weeks    Status New    Target Date 07/01/20      PT LONG TERM GOAL #2   Title Pt will achieve scapular stabilizer strength of at least 4/5 for good shoulder mechanics with reaching    Time 10    Period Weeks    Status New    Target Date 07/15/20      PT LONG TERM GOAL #3   Title Pt will achieve at least 4/5 strength of Rt shoulder cardinal planes for improved UE use with ADLs.    Time 12    Period Weeks    Status New    Target Date 07/29/20      PT LONG TERM GOAL #4   Title Pt will reduce FOTO to </= 44% to demo less limitation    Baseline 96%    Time 12    Period Weeks    Status New    Target Date 07/29/20      PT LONG TERM GOAL #5   Title Pt will achieve functional shoulder extension for grooming and dressing with min  or no pain    Time 12    Period Weeks    Status New    Target Date 07/29/20                 Plan - 06/09/20 1646    Clinical Impression Statement Pt saw MD and has been cleared to progress to AA/ROM phase.  Session spent exploring tolerance of multiple different options for this phase, all of which were well tolerated.  Pt reported some brief soreness with supine dowel AA/ROM flexion but this didn't last.  He has excellent elevation of Lt shoulder, just 10 degrees shy of full range.  He has good pain control and has d/c'd use of brace at home, with caution to avoid lifting or abrupt movements of the arm.  HEP updated today with guidance on monitoring for pain and encouragement to perform AA/ROM to first motion barrier only.  Continue along POC into Phase 2 of protocol.    PT Frequency 2x / week    PT Duration 12 weeks    PT Treatment/Interventions ADLs/Self Care Home Management;Cryotherapy;Electrical Stimulation;Moist Heat;Neuromuscular re-education;Therapeutic exercise;Therapeutic activities;Functional mobility training;Patient/family education;Manual techniques;Passive range of motion;Joint Manipulations;Spinal Manipulations    PT Next Visit Plan pulleys, review HEP updates from last visit, add yellow elbow ext and flexion as tol, UBE    Consulted and Agree with Plan of Care Patient           Patient will benefit from skilled therapeutic intervention in order to improve the following deficits and impairments:     Visit Diagnosis: Stiffness of left shoulder, not elsewhere classified  Muscle weakness (generalized)     Problem List Patient Active Problem List   Diagnosis Date Noted  . SOB (shortness of breath) 09/09/2018  . Atrial fibrillation, chronic (HCC) 07/23/2018  . Pain in right knee 04/24/2018  . Medication management 04/18/2018  .  Permanent atrial fibrillation (HCC) 10/10/2016  . Persistent atrial fibrillation (HCC) 09/11/2016  . RBBB 09/11/2016  . Need for  vaccine for DT (diphtheria-tetanus) 01/25/2016  . Pain of left heel 11/25/2015  . Rib pain on right side 11/25/2015  . Compression fracture of L1 lumbar vertebra (HCC) 11/24/2015  . Gilbert's syndrome 11/24/2015  . Lower back pain 11/24/2015  . 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 Arthralgia 11/08/2011  . CALF PAIN, LEFT 07/16/2008  . MUSCLE STRAIN, LEFT CALF 07/16/2008    Morton Peters, PT 06/09/20 4:50 PM   [facility] [address] [place], [place], [address] Phone: [phone]   Fax:  [phone]  Name: Andre Parker MRN: [id number] Date of Birth: [birth year]

## 2020-06-16 ENCOUNTER — Other Ambulatory Visit: Payer: Self-pay

## 2020-06-16 ENCOUNTER — Encounter: Payer: Self-pay | Admitting: Physical Therapy

## 2020-06-16 ENCOUNTER — Ambulatory Visit: Payer: Medicare Other | Attending: Orthopedic Surgery | Admitting: Physical Therapy

## 2020-06-16 DIAGNOSIS — M25612 Stiffness of left shoulder, not elsewhere classified: Secondary | ICD-10-CM | POA: Insufficient documentation

## 2020-06-16 DIAGNOSIS — M6281 Muscle weakness (generalized): Secondary | ICD-10-CM | POA: Insufficient documentation

## 2020-06-16 NOTE — Therapy (Signed)
[facility] [address], [address] [place], [place], [address] Phone: [phone]   Fax:  [phone]  Physical Therapy Treatment  Patient Details  Name: Andre Parker MRN: [id number] Date of Birth: [birth year] Referring Provider (PT): Francena Hanly MD   Encounter Date: 06/16/2020   PT End of Session - 06/16/20 1453    Visit Number 7    Date for PT Re-Evaluation 07/29/20    Authorization Type UHC Medicare    Progress Note Due on Visit 10    PT Start Time 1454   Pt late   PT Stop Time 1530    PT Time Calculation (min) 36 min    Activity Tolerance Patient tolerated treatment well    Behavior During Therapy [facility] for tasks assessed/performed           Past Medical History:  Diagnosis Date  . Atrial fibrillation (HCC)   . Low back pain     Past Surgical History:  Procedure Laterality Date  . LUMBAR LAMINECTOMY    . ROTATOR CUFF REPAIR Right   . TONSILLECTOMY    . VASECTOMY      There were no vitals filed for this visit.                      [facility] Adult PT Treatment/Exercise - 06/16/20 0001      Exercises   Exercises Shoulder;Elbow      Elbow Exercises   Elbow Extension Strengthening;Both;Theraband;20 reps    Theraband Level (Elbow Extension) Level 2 (Red)      Shoulder Exercises: Supine   Flexion AAROM;Both;15 reps    Flexion Limitations dowel, hold dowel for flexion and scaption each x 15, hold end range x 5" intermittent reps for stretch      Shoulder Exercises: Standing   Flexion AAROM;15 reps;Left    Flexion Limitations wall slides, PT cued drop scapula at end of range    Extension AROM;Left;15 reps    Extension Limitations bent over with Rt UE counter support vs prone from last visit    Row AROM;Left;15 reps    Row Limitations bent over with Rt UE counter support vs prone from last visit      Shoulder Exercises: ROM/Strengthening   UBE (Upper Arm Bike) L1.5 2x2 fwd/bwd, PT present to  monitor    Other ROM/Strengthening Exercises ball on table ABCs 1x, standing, PT cued scap stability small letter palm drawing vs large letter drawing with more AA/ROM                    PT Short Term Goals - 05/27/20 1128      PT SHORT TERM GOAL #3   Title Pt will achieve Rt shoulder P/ROM of at least 120 deg scaption, and 45 deg IR/ER in scapular plane to prep for AA/ROM.    Time 4    Period Weeks    Status Achieved    Target Date 06/03/20             PT Long Term Goals - 05/06/20 1240      PT LONG TERM GOAL #1   Title Pt will achieve AA/ROM of Rt shoulder to at least 120 deg flexion, 90 deg abd, and 60 deg ER for improved UE functional use    Time 8    Period Weeks    Status New    Target Date 07/01/20      PT LONG TERM GOAL #2  Title Pt will achieve scapular stabilizer strength of at least 4/5 for good shoulder mechanics with reaching    Time 10    Period Weeks    Status New    Target Date 07/15/20      PT LONG TERM GOAL #3   Title Pt will achieve at least 4/5 strength of Rt shoulder cardinal planes for improved UE use with ADLs.    Time 12    Period Weeks    Status New    Target Date 07/29/20      PT LONG TERM GOAL #4   Title Pt will reduce FOTO to </= 44% to demo less limitation    Baseline 96%    Time 12    Period Weeks    Status New    Target Date 07/29/20      PT LONG TERM GOAL #5   Title Pt will achieve functional shoulder extension for grooming and dressing with min or no pain    Time 12    Period Weeks    Status New    Target Date 07/29/20                 Plan - 06/16/20 1618    Clinical Impression Statement Pt is 7 weeks out from [name] and bicep tendon repair of Lt shoulder.  He is progressing well along AA/ROM phase of protocol.  PT reviewed ther ex from last visit and added elbow ext with red band in standing to HEP today.  PT modified prone Lt shoulder ext and row to bent over at counter secondary to it not working on his bed  at home.  PT also cued more scapular positioning and control with wall slides and table ABCs with ball.  Pt was finger walking ball with large ABC letters so PT added a 2nd round with smaller palm press ABC with scapular retraction/depression for more stability cue around Lt shoulder.  Pt reports occassional Lt deltoid insertion discomfort pain during daily activities so PT cautioned him to increase awareness to be sure he isn't overdoing before we can start to develop more RC strength.  Pt will likely be ready for Phase 3 A/ROM phase on target for 06/23/20 which is at 8 weeks post-op.    Rehab Potential Excellent    PT Frequency 2x / week    PT Duration 12 weeks    PT Treatment/Interventions ADLs/Self Care Home Management;Cryotherapy;Electrical Stimulation;Moist Heat;Neuromuscular re-education;Therapeutic exercise;Therapeutic activities;Functional mobility training;Patient/family education;Manual techniques;Passive range of motion;Joint Manipulations;Spinal Manipulations    PT Next Visit Plan continue AA/ROM, elbow ext red band, table ABCs large and small letters, monitor scapular control    Consulted and Agree with Plan of Care Patient           Patient will benefit from skilled therapeutic intervention in order to improve the following deficits and impairments:     Visit Diagnosis: Stiffness of left shoulder, not elsewhere classified  Muscle weakness (generalized)     Problem List Patient Active Problem List   Diagnosis Date Noted  . SOB (shortness of breath) 09/09/2018  . Atrial fibrillation, chronic (HCC) 07/23/2018  . Pain in right knee 04/24/2018  . Medication management 04/18/2018  . Permanent atrial fibrillation (HCC) 10/10/2016  . Persistent atrial fibrillation (HCC) 09/11/2016  . RBBB 09/11/2016  . Need for vaccine for DT (diphtheria-tetanus) 01/25/2016  . Pain of left heel 11/25/2015  . Rib pain on right side 11/25/2015  . Compression fracture of L1 lumbar vertebra (HCC)  11/24/2015  . Gilbert's syndrome 11/24/2015  . Lower back pain 11/24/2015  . 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 Arthralgia 11/08/2011  . CALF PAIN, LEFT 07/16/2008  . MUSCLE STRAIN, LEFT CALF 07/16/2008    Baruch Merl, PT 06/16/20 4:23 PM   [facility] [address] [place], [place], [address] Phone: [phone]   Fax:  [phone]  Name: Andre Parker MRN: [id number] Date of Birth: 09/23/1945

## 2020-06-19 ENCOUNTER — Ambulatory Visit: Payer: Medicare Other | Admitting: Physical Therapy

## 2020-06-19 ENCOUNTER — Encounter: Payer: Self-pay | Admitting: Physical Therapy

## 2020-06-19 ENCOUNTER — Other Ambulatory Visit: Payer: Self-pay

## 2020-06-19 DIAGNOSIS — M25612 Stiffness of left shoulder, not elsewhere classified: Secondary | ICD-10-CM | POA: Diagnosis not present

## 2020-06-19 DIAGNOSIS — M6281 Muscle weakness (generalized): Secondary | ICD-10-CM

## 2020-06-19 NOTE — Therapy (Signed)
[facility] [address], [address] [place], [place], [address] Phone: [phone]   Fax:  [phone]  Physical Therapy Treatment  Patient Details  Name: Andre Parker MRN: [id number] Date of Birth: Sep 12, 1945 Referring Provider (PT): Francena Hanly MD   Encounter Date: 06/19/2020   PT End of Session - 06/19/20 1025    Visit Number 8    Date for PT Re-Evaluation 07/29/20    Authorization Type UHC Medicare    Progress Note Due on Visit 10    PT Start Time 1025    PT Stop Time 1108    PT Time Calculation (min) 43 min    Activity Tolerance --    Behavior During Therapy [facility] for tasks assessed/performed           Past Medical History:  Diagnosis Date  . Atrial fibrillation (HCC)   . Low back pain     Past Surgical History:  Procedure Laterality Date  . LUMBAR LAMINECTOMY    . ROTATOR CUFF REPAIR Right   . TONSILLECTOMY    . VASECTOMY      There were no vitals filed for this visit.   Subjective Assessment - 06/19/20 1029    Subjective No new complaints, doing well.    Patient Stated Goals return to PLOF (full Lt UE use)    Currently in Pain? Yes    Pain Score 2     Pain Location Arm    Pain Orientation Left    Pain Descriptors / Indicators Dull    Multiple Pain Sites No              [facility] PT Assessment - 06/19/20 0001      AROM   Left Shoulder Flexion 124 Degrees    Left Shoulder ABduction 150 Degrees    Left Shoulder External Rotation --   1 inch difference behind the back bt Rt & LT                        [facility] Adult PT Treatment/Exercise - 06/19/20 0001      Shoulder Exercises: Standing   Flexion AAROM;Left;20 reps    Flexion Limitations finger ladder/wall slides    Extension Limitations bent over counter top 2x10    Row Limitations bent over rows at counter top 2x10    Other Standing Exercises Closed chain ball rolls in 4 diretions 10x each      Shoulder Exercises: Pulleys    Flexion 3 minutes    Scaption 3 minutes      Shoulder Exercises: ROM/Strengthening   UBE (Upper Arm Bike) L 1.5 3x3 with PTA present      Shoulder Exercises: Stretch   Other Shoulder Stretches Standing flexion stretch reaching overhead: will do this at home, held 30 sec 3x    Other Shoulder Stretches 90 degree angle standing at counter top                    PT Short Term Goals - 05/27/20 1128      PT SHORT TERM GOAL #3   Title Pt will achieve Rt shoulder P/ROM of at least 120 deg scaption, and 45 deg IR/ER in scapular plane to prep for AA/ROM.    Time 4    Period Weeks    Status Achieved    Target Date 06/03/20             PT Long Term Goals - 05/06/20 1240  PT LONG TERM GOAL #1   Title Pt will achieve AA/ROM of Rt shoulder to at least 120 deg flexion, 90 deg abd, and 60 deg ER for improved UE functional use    Time 8    Period Weeks    Status New    Target Date 07/01/20      PT LONG TERM GOAL #2   Title Pt will achieve scapular stabilizer strength of at least 4/5 for good shoulder mechanics with reaching    Time 10    Period Weeks    Status New    Target Date 07/15/20      PT LONG TERM GOAL #3   Title Pt will achieve at least 4/5 strength of Rt shoulder cardinal planes for improved UE use with ADLs.    Time 12    Period Weeks    Status New    Target Date 07/29/20      PT LONG TERM GOAL #4   Title Pt will reduce FOTO to </= 44% to demo less limitation    Baseline 96%    Time 12    Period Weeks    Status New    Target Date 07/29/20      PT LONG TERM GOAL #5   Title Pt will achieve functional shoulder extension for grooming and dressing with min or no pain    Time 12    Period Weeks    Status New    Target Date 07/29/20                 Plan - 06/19/20 1025    Clinical Impression Statement Pt continues to do well with his AA/ROM exercises and essentially has no pain. AROM were taken for baseline, see chart. Pt reports he was able  to play handbells at church this past weekend. Pt tolerated closed chain ball rolls on the wall and kept good shoulder mechanics. In fact pt had good mechanics throughout the entire session today.    Examination-Activity Limitations Lift;Reach Overhead;Bathing;Transfers;Carry;Dressing    Examination-Participation Restrictions Cleaning;Yard Work;Shop;Laundry;Community Activity;Meal Prep    Stability/Clinical Decision Making Stable/Uncomplicated    Rehab Potential Excellent    PT Frequency 2x / week    PT Duration 12 weeks    PT Treatment/Interventions ADLs/Self Care Home Management;Cryotherapy;Electrical Stimulation;Moist Heat;Neuromuscular re-education;Therapeutic exercise;Therapeutic activities;Functional mobility training;Patient/family education;Manual techniques;Passive range of motion;Joint Manipulations;Spinal Manipulations    PT Next Visit Plan continue AA/ROM, elbow ext red band, table ABCs large and small letters, monitor scapular control    PT Home Exercise Plan Pt already has pendulum ther ex from MD    Consulted and Agree with Plan of Care Patient           Patient will benefit from skilled therapeutic intervention in order to improve the following deficits and impairments:  Decreased range of motion,Postural dysfunction,Decreased strength,Impaired UE functional use,Hypomobility,Decreased mobility  Visit Diagnosis: Stiffness of left shoulder, not elsewhere classified  Muscle weakness (generalized)     Problem List Patient Active Problem List   Diagnosis Date Noted  . SOB (shortness of breath) 09/09/2018  . Atrial fibrillation, chronic (HCC) 07/23/2018  . Pain in right knee 04/24/2018  . Medication management 04/18/2018  . Permanent atrial fibrillation (HCC) 10/10/2016  . Persistent atrial fibrillation (HCC) 09/11/2016  . RBBB 09/11/2016  . Need for vaccine for DT (diphtheria-tetanus) 01/25/2016  . Pain of left heel 11/25/2015  . Rib pain on right side 11/25/2015  .  Compression fracture of L1 lumbar vertebra (HCC)  11/24/2015  . Gilbert's syndrome 11/24/2015  . Lower back pain 11/24/2015  . 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 Arthralgia 11/08/2011  . CALF PAIN, LEFT 07/16/2008  . MUSCLE STRAIN, LEFT CALF 07/16/2008    Verle Wheeling, PTA 06/19/2020, 11:12 AM   [facility] [address] [place], [place], [address] Phone: [phone]   Fax:  [phone]  Name: Andre Parker MRN: [id number] Date of Birth: Nov 04, 1945

## 2020-06-22 ENCOUNTER — Other Ambulatory Visit: Payer: Self-pay

## 2020-06-22 ENCOUNTER — Encounter: Payer: Self-pay | Admitting: Physical Therapy

## 2020-06-22 ENCOUNTER — Ambulatory Visit: Payer: Medicare Other | Admitting: Physical Therapy

## 2020-06-22 DIAGNOSIS — M6281 Muscle weakness (generalized): Secondary | ICD-10-CM

## 2020-06-22 DIAGNOSIS — M25612 Stiffness of left shoulder, not elsewhere classified: Secondary | ICD-10-CM | POA: Diagnosis not present

## 2020-06-22 NOTE — Therapy (Signed)
[facility] [address] [place], [place], [address] Phone: [phone]   Fax:  [phone]  Physical Therapy Treatment  Patient Details  Name: Andre Parker MRN: [id number] Date of Birth: 1946-03-31 Referring Provider (PT): Justice Britain MD   Encounter Date: 06/22/2020   PT End of Session - 06/22/20 1449    Visit Number 9    Date for PT Re-Evaluation 07/29/20    Authorization Type UHC Medicare    Progress Note Due on Visit 10    PT Start Time 1448    PT Stop Time 1526    PT Time Calculation (min) 38 min    Activity Tolerance Patient tolerated treatment well    Behavior During Therapy [facility] for tasks assessed/performed           Past Medical History:  Diagnosis Date  . Atrial fibrillation ([place])   . Low back pain     Past Surgical History:  Procedure Laterality Date  . LUMBAR LAMINECTOMY    . ROTATOR CUFF REPAIR Right   . TONSILLECTOMY    . VASECTOMY      There were no vitals filed for this visit.   Subjective Assessment - 06/22/20 1451    Subjective I did fine after the last session.    Currently in Pain? No/denies    Multiple Pain Sites No                             [facility] Adult PT Treatment/Exercise - 06/22/20 0001      Shoulder Exercises: Standing   ABduction AAROM;Left;20 reps   On the wall   Extension Limitations bent over counter top 2x10   added 1#   Row Limitations bent over rows at counter top 2x10    Retraction Weight (lbs) 2    Other Standing Exercises Closed chain ball rolls in 4 diretions 20x each    Other Standing Exercises green band tricep ext 2x10      Shoulder Exercises: Pulleys   Flexion 3 minutes    Scaption 3 minutes      Shoulder Exercises: ROM/Strengthening   UBE (Upper Arm Bike) L 1.7 3x3 for warm up and discussion of current status                    PT Short Term Goals - 06/22/20 1515      PT SHORT TERM GOAL #4   Title Pt will wean  from sling (MD directed) and advance to AA/ROM with min pain not to exceed 3/10    Time 4    Period Weeks    Status Achieved    Target Date 06/03/20             PT Long Term Goals - 05/06/20 1240      PT LONG TERM GOAL #1   Title Pt will achieve AA/ROM of Rt shoulder to at least 120 deg flexion, 90 deg abd, and 60 deg ER for improved UE functional use    Time 8    Period Weeks    Status New    Target Date 07/01/20      PT LONG TERM GOAL #2   Title Pt will achieve scapular stabilizer strength of at least 4/5 for good shoulder mechanics with reaching    Time 10    Period Weeks    Status New    Target Date 07/15/20  PT LONG TERM GOAL #3   Title Pt will achieve at least 4/5 strength of Rt shoulder cardinal planes for improved UE use with ADLs.    Time 12    Period Weeks    Status New    Target Date 07/29/20      PT LONG TERM GOAL #4   Title Pt will reduce FOTO to </= 44% to demo less limitation    Baseline 96%    Time 12    Period Weeks    Status New    Target Date 07/29/20      PT LONG TERM GOAL #5   Title Pt will achieve functional shoulder extension for grooming and dressing with min or no pain    Time 12    Period Weeks    Status New    Target Date 07/29/20                 Plan - 06/22/20 1450    Clinical Impression Statement Pt arrives pain free today and shared pictures of his home gym. He has an [employer] that have moving arms. PTA suggested it would be ok to use if he was interested in cardiovascular exercise but he should not crank the resisatnce on the arms at this time, think more range of motion. Pt is having no difficulty with this new phase of protocol. Pt could add very light weight to his bent over rows and extensions today. All short term goals are met as of today.    Examination-Activity Limitations Lift;Reach Overhead;Bathing;Transfers;Carry;Dressing    Examination-Participation Restrictions Cleaning;Yard  Work;Shop;Laundry;Community Activity;Meal Prep    Stability/Clinical Decision Making Stable/Uncomplicated    Rehab Potential Excellent    PT Frequency 2x / week    PT Duration 12 weeks    PT Treatment/Interventions ADLs/Self Care Home Management;Cryotherapy;Electrical Stimulation;Moist Heat;Neuromuscular re-education;Therapeutic exercise;Therapeutic activities;Functional mobility training;Patient/family education;Manual techniques;Passive range of motion;Joint Manipulations;Spinal Manipulations    PT Next Visit Plan continue AA/ROM, elbow ext red band, table ABCs large and small letters, monitor scapular control: 10th visit PN    Consulted and Agree with Plan of Care Patient           Patient will benefit from skilled therapeutic intervention in order to improve the following deficits and impairments:  Decreased range of motion,Postural dysfunction,Decreased strength,Impaired UE functional use,Hypomobility,Decreased mobility  Visit Diagnosis: Stiffness of left shoulder, not elsewhere classified  Muscle weakness (generalized)     Problem List Patient Active Problem List   Diagnosis Date Noted  . SOB (shortness of breath) 09/09/2018  . Atrial fibrillation, chronic ([place]) 07/23/2018  . Pain in right knee 04/24/2018  . Medication management 04/18/2018  . Permanent atrial fibrillation ([place]) 10/10/2016  . Persistent atrial fibrillation ([place]) 09/11/2016  . RBBB 09/11/2016  . Need for vaccine for DT (diphtheria-tetanus) 01/25/2016  . Pain of left heel 11/25/2015  . Rib pain on right side 11/25/2015  . Compression fracture of L1 lumbar vertebra ([place]) 11/24/2015  . Gilbert's syndrome 11/24/2015  . Lower back pain 11/24/2015  . Osteoarthritis of carpometacarpal [facility]) joint of left thumb 11/24/2015  . Arthralgia 11/08/2011  . CALF PAIN, LEFT 07/16/2008  . MUSCLE STRAIN, LEFT CALF 07/16/2008    Collins Dimaria, PTA 06/22/2020, 3:24 PM  [place] [facility] [address], [place], [place], [address] Phone: [phone]   Fax:  [phone]  Name: Qaadir Kent MRN: [id number] Date of Birth: 05-Sep-1945

## 2020-06-24 ENCOUNTER — Encounter: Payer: Self-pay | Admitting: Physical Therapy

## 2020-06-24 ENCOUNTER — Ambulatory Visit: Payer: Medicare Other | Admitting: Physical Therapy

## 2020-06-24 ENCOUNTER — Other Ambulatory Visit: Payer: Self-pay

## 2020-06-24 DIAGNOSIS — M25612 Stiffness of left shoulder, not elsewhere classified: Secondary | ICD-10-CM

## 2020-06-24 DIAGNOSIS — M6281 Muscle weakness (generalized): Secondary | ICD-10-CM

## 2020-06-24 NOTE — Therapy (Addendum)
[facility] [address] [place], [place], [address] Phone: [phone]   Fax:  [phone]  Physical Therapy Treatment  Patient Details  Name: Andre Parker MRN: [id number] Date of Birth: [birth year] Referring Provider (PT): Francena Hanly MD  Progress Note Reporting Period 05/06/20 to 06/24/20  See note below for Objective Data and Assessment of Progress/Goals.       Encounter Date: 06/24/2020   PT End of Session - 06/24/20 1448    Visit Number 10    Date for PT Re-Evaluation 07/29/20    Authorization Type [employer] Medicare    Progress Note Due on Visit 10    PT Start Time 1445    PT Stop Time 1530    PT Time Calculation (min) 45 min    Activity Tolerance Patient tolerated treatment well    Behavior During Therapy WFL for tasks assessed/performed           Past Medical History:  Diagnosis Date  . Atrial fibrillation (HCC)   . Low back pain     Past Surgical History:  Procedure Laterality Date  . LUMBAR LAMINECTOMY    . ROTATOR CUFF REPAIR Right   . TONSILLECTOMY    . VASECTOMY      There were no vitals filed for this visit.   Subjective Assessment - 06/24/20 1449    Subjective No complaints. No current pain, VERY intermittent twinge in arm that does not last.    Currently in Pain? No/denies              [facility] PT Assessment - 06/24/20 0001      Assessment   Medical Diagnosis Z98.890 (ICD-10-CM) - S/P rotator cuff repair    Referring Provider (PT) Francena Hanly MD    Onset Date/Surgical Date 04/28/20      Observation/Other Assessments   Focus on Therapeutic Outcomes (FOTO)  44% limited      AROM   Left Shoulder Flexion 130 Degrees    Left Shoulder ABduction 150 Degrees    Left Shoulder Internal Rotation --   1 inch difference between RT & Lt   Left Shoulder External Rotation --   Pt can touch back of his head without issue at 90 degrees  of abduciton.                         [facility] Adult PT Treatment/Exercise - 06/24/20 0001      Shoulder Exercises: Supine   Flexion AAROM;Both;20 reps   cane     Shoulder Exercises: Sidelying   External Rotation AROM;Left;20 reps   Added to HEP   ABduction AROM;Left;20 reps   Added to HEP today     Shoulder Exercises: Standing   Extension Limitations bent over counter top 2x10   added 2#   Row Limitations bent over rows at counter top 2x10    Retraction Weight (lbs) 2      Shoulder Exercises: Pulleys   Flexion 3 minutes    Scaption 3 minutes      Shoulder Exercises: ROM/Strengthening   UBE (Upper Arm Bike) L2  3x3 with discussion of current status                  PT Education - 06/24/20 1500    Education Details HEP    Person(s) Educated Patient    Methods Explanation;Tactile cues;Verbal cues;Handout    Comprehension Returned demonstration;Verbalized understanding  PT Short Term Goals - 06/22/20 1515      PT SHORT TERM GOAL #4   Title Pt will wean from sling (MD directed) and advance to AA/ROM with min pain not to exceed 3/10    Time 4    Period Weeks    Status Achieved    Target Date 06/03/20             PT Long Term Goals - 05/06/20 1240      PT LONG TERM GOAL #1   Title Pt will achieve AA/ROM of Rt shoulder to at least 120 deg flexion, 90 deg abd, and 60 deg ER for improved UE functional use    Time 8    Period Weeks    Status New    Target Date 07/01/20      PT LONG TERM GOAL #2   Title Pt will achieve scapular stabilizer strength of at least 4/5 for good shoulder mechanics with reaching    Time 10    Period Weeks    Status New    Target Date 07/15/20      PT LONG TERM GOAL #3   Title Pt will achieve at least 4/5 strength of Rt shoulder cardinal planes for improved UE use with ADLs.    Time 12    Period Weeks    Status New    Target Date 07/29/20      PT LONG TERM GOAL #4   Title Pt will reduce FOTO to </= 44% to demo less  limitation    Baseline 96%    Time 12    Period Weeks    Status New    Target Date 07/29/20      PT LONG TERM GOAL #5   Title Pt will achieve functional shoulder extension for grooming and dressing with min or no pain    Time 12    Period Weeks    Status New    Target Date 07/29/20                 Plan - 06/24/20 1451    Clinical Impression Statement Pt's active flexion sitting increased to 130 degrees. FOTO score today 44% limited, meeting goal. Today is first day pt is able to venture into phase 3 of protocol which introduced sidelying ER and abduction which was given for HEP. Pt is not having any issues with pain.    Examination-Activity Limitations Lift;Reach Overhead;Bathing;Transfers;Carry;Dressing    Examination-Participation Restrictions Cleaning;Yard Work;Shop;Laundry;Community Activity;Meal Prep    Stability/Clinical Decision Making Stable/Uncomplicated    Rehab Potential Excellent    PT Frequency 2x / week    PT Duration 12 weeks    PT Treatment/Interventions ADLs/Self Care Home Management;Cryotherapy;Electrical Stimulation;Moist Heat;Neuromuscular re-education;Therapeutic exercise;Therapeutic activities;Functional mobility training;Patient/family education;Manual techniques;Passive range of motion;Joint Manipulations;Spinal Manipulations    PT Next Visit Plan To MD 1/24. Pt can now enter in phase 3 of protocol, review sidelying ER & abduction.    PT Home Exercise Plan Pt already has pendulum ther ex from MD    Recommended Other Services Access Code: [email or web address]    Consulted and Agree with Plan of Care Patient           Patient will benefit from skilled therapeutic intervention in order to improve the following deficits and impairments:  Decreased range of motion,Postural dysfunction,Decreased strength,Impaired UE functional use,Hypomobility,Decreased mobility  Visit Diagnosis: Stiffness of left shoulder, not elsewhere classified  Muscle weakness  (generalized)     Problem List Patient Active Problem  List   Diagnosis Date Noted  . SOB (shortness of breath) 09/09/2018  . Atrial fibrillation, chronic (HCC) 07/23/2018  . Pain in right knee 04/24/2018  . Medication management 04/18/2018  . Permanent atrial fibrillation (HCC) 10/10/2016  . Persistent atrial fibrillation (HCC) 09/11/2016  . RBBB 09/11/2016  . Need for vaccine for DT (diphtheria-tetanus) 01/25/2016  . Pain of left heel 11/25/2015  . Rib pain on right side 11/25/2015  . Compression fracture of L1 lumbar vertebra (HCC) 11/24/2015  . Gilbert's syndrome 11/24/2015  . Lower back pain 11/24/2015  . 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 Arthralgia 11/08/2011  . CALF PAIN, LEFT 07/16/2008  . MUSCLE STRAIN, LEFT CALF 07/16/2008    Terrance Lanahan, PTA 06/24/2020, 3:31 PM  Morton Peters, PT 06/25/20 5:25 PM   [facility] [address], [address] [place], [place], [address] Phone: [phone]   Fax:  [phone]  Name: Andre Parker MRN: [id number] Date of Birth: 1946/03/31  Access Code: [facility]

## 2020-06-30 ENCOUNTER — Other Ambulatory Visit: Payer: Self-pay

## 2020-06-30 ENCOUNTER — Ambulatory Visit: Payer: Medicare Other | Admitting: Physical Therapy

## 2020-06-30 ENCOUNTER — Encounter: Payer: Self-pay | Admitting: Physical Therapy

## 2020-06-30 DIAGNOSIS — M25612 Stiffness of left shoulder, not elsewhere classified: Secondary | ICD-10-CM | POA: Diagnosis not present

## 2020-06-30 DIAGNOSIS — M6281 Muscle weakness (generalized): Secondary | ICD-10-CM

## 2020-06-30 NOTE — Therapy (Signed)
[facility] [address], [address] [place], [place], [address] Phone: [phone]   Fax:  [phone]  Physical Therapy Treatment  Patient Details  Name: Andre Parker MRN: [id number] Date of Birth: 11/15/1945 Referring Provider (PT): Francena Hanly MD   Encounter Date: 06/30/2020   PT End of Session - 06/30/20 1451    Visit Number 11    Date for PT Re-Evaluation 07/29/20    Authorization Type UHC Medicare    Progress Note Due on Visit 20    PT Start Time 1447    PT Stop Time 1530    PT Time Calculation (min) 43 min    Activity Tolerance Patient tolerated treatment well    Behavior During Therapy [facility] for tasks assessed/performed           Past Medical History:  Diagnosis Date  . Atrial fibrillation (HCC)   . Low back pain     Past Surgical History:  Procedure Laterality Date  . LUMBAR LAMINECTOMY    . ROTATOR CUFF REPAIR Right   . TONSILLECTOMY    . VASECTOMY      There were no vitals filed for this visit.   Subjective Assessment - 06/30/20 1447    Subjective Intermittent twinges in back of Lt shoulder.    Pertinent History Pt in Lt UE sling x 6 weeks with exception of exercise and bathing, maintain bend in Lt elbow for now due to bicep repair healing    Patient Stated Goals return to PLOF (full Lt UE use)    Currently in Pain? No/denies                             [facility] Adult PT Treatment/Exercise - 06/30/20 0001      Neuro Re-ed    Neuro Re-ed Details  Lt scapular PNF in sidelying retraction/depression x 20 reps      Exercises   Exercises Shoulder      Shoulder Exercises: Prone   Retraction Weight (lbs) 2    Retraction Limitations Lt row 2lb at counter, bent over    Extension Strengthening;Left;Weights    Extension Weight (lbs) 2    Extension Limitations at counter, bent over      Shoulder Exercises: Sidelying   External Rotation Left;AROM;15 reps    External Rotation  Limitations PT cued not to push end range, some bicep tendon twinges at end range    ABduction AROM;Left;20 reps    ABduction Limitations 2x10      Shoulder Exercises: Standing   Flexion AROM;Left;10 reps    Flexion Limitations 2x5, PT TC for scapular mechanics      Shoulder Exercises: Pulleys   Flexion 2 minutes    Flexion Limitations standing, PT TC scapular mechanics      Shoulder Exercises: ROM/Strengthening   UBE (Upper Arm Bike) L2.5 3x3 PT present to monitor and discuss symptoms    Ball on Wall up/down/Lt/Rt and circles x 10 each, Lt    Other ROM/Strengthening Exercises large red ball on wall UE walk ups into shoulder flexion bil x 10 reps      Manual Therapy   Manual Therapy Soft tissue mobilization    Soft tissue mobilization Lt bicep tendon, Lt posterior deltoid and infraspinatus                    PT Short Term Goals - 06/22/20 1515      PT SHORT TERM  GOAL #4   Title Pt will wean from sling (MD directed) and advance to AA/ROM with min pain not to exceed 3/10    Time 4    Period Weeks    Status Achieved    Target Date 06/03/20             PT Long Term Goals - 05/06/20 1240      PT LONG TERM GOAL #1   Title Pt will achieve AA/ROM of Rt shoulder to at least 120 deg flexion, 90 deg abd, and 60 deg ER for improved UE functional use    Time 8    Period Weeks    Status New    Target Date 07/01/20      PT LONG TERM GOAL #2   Title Pt will achieve scapular stabilizer strength of at least 4/5 for good shoulder mechanics with reaching    Time 10    Period Weeks    Status New    Target Date 07/15/20      PT LONG TERM GOAL #3   Title Pt will achieve at least 4/5 strength of Rt shoulder cardinal planes for improved UE use with ADLs.    Time 12    Period Weeks    Status New    Target Date 07/29/20      PT LONG TERM GOAL #4   Title Pt will reduce FOTO to </= 44% to demo less limitation    Baseline 96%    Time 12    Period Weeks    Status New     Target Date 07/29/20      PT LONG TERM GOAL #5   Title Pt will achieve functional shoulder extension for grooming and dressing with min or no pain    Time 12    Period Weeks    Status New    Target Date 07/29/20                 Plan - 06/30/20 1536    Clinical Impression Statement Pt with excellent progress with Lt shoulder A/ROM for flexion and abduction as he is progression into active motion phase of protocol.  He needs initial TC and VC for scapulohumeral rhythm but then improves.  He reaches fatigue with standing shoulder flexion after approx 5 reps.  He reported some posterior and anterior twinges of pain with sidelying ER at end range so PT cued to use only ROM that came more easily to him.  Pt was somewhat sore in Lt infraspinatus, posterior deltoid, and along bicep tendon today so manual techniques used for STM to these structures sprinkled throughout session.  Pt sees MD next week.    Rehab Potential Excellent    PT Frequency 2x / week    PT Duration 12 weeks    PT Treatment/Interventions ADLs/Self Care Home Management;Cryotherapy;Electrical Stimulation;Moist Heat;Neuromuscular re-education;Therapeutic exercise;Therapeutic activities;Functional mobility training;Patient/family education;Manual techniques;Passive range of motion;Joint Manipulations;Spinal Manipulations    PT Next Visit Plan To MD 1/24. Pt to continue phase 3 of protocol, TCs as needed for scapulohumeral rhythm and VC to avoid pushing end range, manual as needed    Consulted and Agree with Plan of Care Patient           Patient will benefit from skilled therapeutic intervention in order to improve the following deficits and impairments:     Visit Diagnosis: Stiffness of left shoulder, not elsewhere classified  Muscle weakness (generalized)     Problem List Patient Active Problem List  Diagnosis Date Noted  . SOB (shortness of breath) 09/09/2018  . Atrial fibrillation, chronic (HCC) 07/23/2018  .  Pain in right knee 04/24/2018  . Medication management 04/18/2018  . Permanent atrial fibrillation (HCC) 10/10/2016  . Persistent atrial fibrillation (HCC) 09/11/2016  . RBBB 09/11/2016  . Need for vaccine for DT (diphtheria-tetanus) 01/25/2016  . Pain of left heel 11/25/2015  . Rib pain on right side 11/25/2015  . Compression fracture of L1 lumbar vertebra (HCC) 11/24/2015  . Gilbert's syndrome 11/24/2015  . Lower back pain 11/24/2015  . 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 Arthralgia 11/08/2011  . CALF PAIN, LEFT 07/16/2008  . MUSCLE STRAIN, LEFT CALF 07/16/2008    Morton Peters, PT 06/30/20 3:40 PM   [facility] [address] [place], [place], [address] Phone: [phone]   Fax:  [phone]  Name: Andre Parker MRN: [id number] Date of Birth: [birth year]

## 2020-07-03 ENCOUNTER — Other Ambulatory Visit: Payer: Self-pay

## 2020-07-03 ENCOUNTER — Encounter: Payer: Self-pay | Admitting: Physical Therapy

## 2020-07-03 ENCOUNTER — Ambulatory Visit: Payer: Medicare Other | Admitting: Physical Therapy

## 2020-07-03 DIAGNOSIS — M25612 Stiffness of left shoulder, not elsewhere classified: Secondary | ICD-10-CM

## 2020-07-03 DIAGNOSIS — M6281 Muscle weakness (generalized): Secondary | ICD-10-CM

## 2020-07-03 NOTE — Therapy (Signed)
[facility] [address], [address] [place], [place], [address] Phone: [phone]   Fax:  [phone]  Physical Therapy Treatment  Patient Details  Name: Andre Parker MRN: [id number] Date of Birth: [birth year] Referring Provider (PT): Francena Hanly MD   Encounter Date: 07/03/2020   PT End of Session - 07/03/20 1018    Visit Number 12    Date for PT Re-Evaluation 07/29/20    Authorization Type UHC Medicare    Progress Note Due on Visit 20    PT Start Time 1018    PT Stop Time 1100    PT Time Calculation (min) 42 min    Activity Tolerance Patient tolerated treatment well    Behavior During Therapy [facility] for tasks assessed/performed           Past Medical History:  Diagnosis Date  . Atrial fibrillation (HCC)   . Low back pain     Past Surgical History:  Procedure Laterality Date  . LUMBAR LAMINECTOMY    . ROTATOR CUFF REPAIR Right   . TONSILLECTOMY    . VASECTOMY      There were no vitals filed for this visit.   Subjective Assessment - 07/03/20 1018    Subjective Ongoing intermittent twinges in back of Lt shoulder and still hurts to lay on Lt side    Pertinent History Pt in Lt UE sling x 6 weeks with exception of exercise and bathing, maintain bend in Lt elbow for now due to bicep repair healing    Limitations Lifting;House hold activities    Patient Stated Goals return to PLOF (full Lt UE use)    Currently in Pain? No/denies                             [facility] Adult PT Treatment/Exercise - 07/03/20 0001      Exercises   Exercises Shoulder;Elbow      Elbow Exercises   Elbow Flexion AROM;Both;10 reps;Standing    Elbow Flexion Limitations hammer curl and palms forward x 10 each with focused bicep contraction, slow    Elbow Extension Strengthening;Both;20 reps;Standing;Theraband    Theraband Level (Elbow Extension) Level 4 (Blue)      Shoulder Exercises: Supine   Protraction  Strengthening;Left;15 reps;Weights    Protraction Weight (lbs) 1    Other Supine Exercises 1lb bicep curl to 90, then press to 90 with serratus reach x 10 rounds      Shoulder Exercises: Sidelying   External Rotation AROM;Left;10 reps    External Rotation Limitations PT cued scapular setting to avoid posterior shoulder pain      Shoulder Exercises: Standing   Extension Strengthening;Both;20 reps;Theraband    Theraband Level (Shoulder Extension) Level 4 (Blue)    Row Strengthening;Both;15 reps;Theraband    Theraband Level (Shoulder Row) Level 4 (Blue)    Row Limitations over top of doorway    Other Standing Exercises standing w/ foam roller vertical along spine, neck retraction with scap retraction, thoracic spine tall 10x5 sec holds      Shoulder Exercises: ROM/Strengthening   UBE (Upper Arm Bike) L2.5 3x3 PT present to discuss activity level and symptoms    Ball on Wall up/down/Lt/Rt and circles x 10 each, Lt    Other ROM/Strengthening Exercises wall slide Lt with focus on scapular mechanics with end range flexion and scaption x 10      Manual Therapy   Manual Therapy Soft tissue mobilization  Soft tissue mobilization Lt infraspinatus, posterior deltoid, proximal triceps                    PT Short Term Goals - 06/22/20 1515      PT SHORT TERM GOAL #4   Title Pt will wean from sling (MD directed) and advance to AA/ROM with min pain not to exceed 3/10    Time 4    Period Weeks    Status Achieved    Target Date 06/03/20             PT Long Term Goals - 05/06/20 1240      PT LONG TERM GOAL #1   Title Pt will achieve AA/ROM of Rt shoulder to at least 120 deg flexion, 90 deg abd, and 60 deg ER for improved UE functional use    Time 8    Period Weeks    Status New    Target Date 07/01/20      PT LONG TERM GOAL #2   Title Pt will achieve scapular stabilizer strength of at least 4/5 for good shoulder mechanics with reaching    Time 10    Period Weeks     Status New    Target Date 07/15/20      PT LONG TERM GOAL #3   Title Pt will achieve at least 4/5 strength of Rt shoulder cardinal planes for improved UE use with ADLs.    Time 12    Period Weeks    Status New    Target Date 07/29/20      PT LONG TERM GOAL #4   Title Pt will reduce FOTO to </= 44% to demo less limitation    Baseline 96%    Time 12    Period Weeks    Status New    Target Date 07/29/20      PT LONG TERM GOAL #5   Title Pt will achieve functional shoulder extension for grooming and dressing with min or no pain    Time 12    Period Weeks    Status New    Target Date 07/29/20                 Plan - 07/03/20 1115    Clinical Impression Statement Pt progressing very well along rehab protocol.  He has minor need for cueing for scapular mechanics with end range functional reaching (posterior tilt/upward rotation).  PT gave VCs for scapular setting with SL ER of Lt shoulder which eliminated slight discomfort he was feeling with this.  He has full flexion and scaption ROM.  He sees MD on Monday and will likely be ready/on target to proceed into next phase of protocol (PREs) beginning next week pending MD visit input.    PT Frequency 2x / week    PT Duration 12 weeks    PT Treatment/Interventions ADLs/Self Care Home Management;Cryotherapy;Electrical Stimulation;Moist Heat;Neuromuscular re-education;Therapeutic exercise;Therapeutic activities;Functional mobility training;Patient/family education;Manual techniques;Passive range of motion;Joint Manipulations;Spinal Manipulations    PT Next Visit Plan To MD 1/24. Pt likely ready for PREs stage of protocol pending MD feedback    Consulted and Agree with Plan of Care Patient           Patient will benefit from skilled therapeutic intervention in order to improve the following deficits and impairments:     Visit Diagnosis: Stiffness of left shoulder, not elsewhere classified  Muscle weakness  (generalized)     Problem List Patient Active Problem List  Diagnosis Date Noted  . SOB (shortness of breath) 09/09/2018  . Atrial fibrillation, chronic (HCC) 07/23/2018  . Pain in right knee 04/24/2018  . Medication management 04/18/2018  . Permanent atrial fibrillation (HCC) 10/10/2016  . Persistent atrial fibrillation (HCC) 09/11/2016  . RBBB 09/11/2016  . Need for vaccine for DT (diphtheria-tetanus) 01/25/2016  . Pain of left heel 11/25/2015  . Rib pain on right side 11/25/2015  . Compression fracture of L1 lumbar vertebra (HCC) 11/24/2015  . Gilbert's syndrome 11/24/2015  . Lower back pain 11/24/2015  . 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 Arthralgia 11/08/2011  . CALF PAIN, LEFT 07/16/2008  . MUSCLE STRAIN, LEFT CALF 07/16/2008    Doyce Para Tzipora Mcinroy 07/03/2020, 11:21 AM  [facility] [address], [address] [place], [place], [address] Phone: [phone]   Fax:  [phone]  Name: Andre Parker MRN: [id number] Date of Birth: 06/20/1945

## 2020-07-06 ENCOUNTER — Encounter: Payer: Medicare Other | Admitting: Physical Therapy

## 2020-07-07 ENCOUNTER — Telehealth: Payer: Self-pay | Admitting: Cardiology

## 2020-07-07 NOTE — Telephone Encounter (Signed)
New message    Pt says [facility] wants him to be in a [employer]. He said he is going to try and send it through My Chart so Dr Antoine Poche can review and see if he needs to participate.

## 2020-07-08 ENCOUNTER — Ambulatory Visit: Payer: Medicare Other | Admitting: Physical Therapy

## 2020-07-08 ENCOUNTER — Encounter: Payer: Self-pay | Admitting: Physical Therapy

## 2020-07-08 ENCOUNTER — Other Ambulatory Visit: Payer: Self-pay

## 2020-07-08 DIAGNOSIS — M6281 Muscle weakness (generalized): Secondary | ICD-10-CM

## 2020-07-08 DIAGNOSIS — M25612 Stiffness of left shoulder, not elsewhere classified: Secondary | ICD-10-CM | POA: Diagnosis not present

## 2020-07-08 NOTE — Therapy (Signed)
[facility] [address] [place], [place], [address] Phone: [phone]   Fax:  [phone]  Physical Therapy Treatment  Patient Details  Name: Andre Parker MRN: [id number] Date of Birth: [birth year] Referring Provider (PT): Francena Hanly MD   Encounter Date: 07/08/2020   PT End of Session - 07/08/20 1453    Visit Number 13    Date for PT Re-Evaluation 07/29/20    Authorization Type UHC Medicare    Progress Note Due on Visit 20    PT Start Time 1453   Slightly late   PT Stop Time 1526    PT Time Calculation (min) 33 min    Activity Tolerance Patient tolerated treatment well    Behavior During Therapy [facility] for tasks assessed/performed           Past Medical History:  Diagnosis Date  . Atrial fibrillation (HCC)   . Low back pain     Past Surgical History:  Procedure Laterality Date  . LUMBAR LAMINECTOMY    . ROTATOR CUFF REPAIR Right   . TONSILLECTOMY    . VASECTOMY      There were no vitals filed for this visit.   Subjective Assessment - 07/08/20 1452    Subjective Saw MD Monday, got a great report. Continue on with protocol.                             [facility] Adult PT Treatment/Exercise - 07/08/20 0001      Shoulder Exercises: Standing   Extension Strengthening;Both;20 reps;Theraband   Blue band still challenging   Theraband Level (Shoulder Extension) Level 4 (Blue)    Row Strengthening;Both;15 reps;Theraband    Theraband Level (Shoulder Row) Level 4 (Blue)    Other Standing Exercises Lt bicep curls 2 ways: 2# 10x 3# 10x    Other Standing Exercises Red Rockwood 10x each with initial instructions                  PT Education - 07/08/20 1501    Education Details HEP REd Rockwood for HEP    Person(s) Educated Patient    Methods Explanation;Demonstration;Verbal cues;Handout    Comprehension Returned demonstration;Verbalized understanding            PT Short Term  Goals - 06/22/20 1515      PT SHORT TERM GOAL #4   Title Pt will wean from sling (MD directed) and advance to AA/ROM with min pain not to exceed 3/10    Time 4    Period Weeks    Status Achieved    Target Date 06/03/20             PT Long Term Goals - 07/08/20 1518      PT LONG TERM GOAL #5   Title Pt will achieve functional shoulder extension for grooming and dressing with min or no pain    Time 12    Period Weeks    Status Achieved                 Plan - 07/08/20 1454    Clinical Impression Statement Pt received excellent report from his MD this past Monday and is cleared to continue on with protocol. Today we added Rockwood 4 with red band to HEP as well as resisted ( light) bicep curls.    Examination-Activity Limitations Lift;Reach Overhead;Bathing;Transfers;Carry;Dressing    Examination-Participation Restrictions Cleaning;Yard Work;Shop;Laundry;Community Activity;Meal Prep  Stability/Clinical Decision Making Stable/Uncomplicated    Rehab Potential Excellent    PT Frequency 2x / week    PT Treatment/Interventions ADLs/Self Care Home Management;Cryotherapy;Electrical Stimulation;Moist Heat;Neuromuscular re-education;Therapeutic exercise;Therapeutic activities;Functional mobility training;Patient/family education;Manual techniques;Passive range of motion;Joint Manipulations;Spinal Manipulations    PT Next Visit Plan Review Rockwood, conti with protocol    PT Home Exercise Plan Pt already has pendulum ther ex from MD: [facility]    Consulted and Agree with Plan of Care Patient           Patient will benefit from skilled therapeutic intervention in order to improve the following deficits and impairments:  Decreased range of motion,Postural dysfunction,Decreased strength,Impaired UE functional use,Hypomobility,Decreased mobility  Visit Diagnosis: Stiffness of left shoulder, not elsewhere classified  Muscle weakness (generalized)     Problem List Patient  Active Problem List   Diagnosis Date Noted  . SOB (shortness of breath) 09/09/2018  . Atrial fibrillation, chronic (HCC) 07/23/2018  . Pain in right knee 04/24/2018  . Medication management 04/18/2018  . Permanent atrial fibrillation (HCC) 10/10/2016  . Persistent atrial fibrillation (HCC) 09/11/2016  . RBBB 09/11/2016  . Need for vaccine for DT (diphtheria-tetanus) 01/25/2016  . Pain of left heel 11/25/2015  . Rib pain on right side 11/25/2015  . Compression fracture of L1 lumbar vertebra (HCC) 11/24/2015  . Gilbert's syndrome 11/24/2015  . Lower back pain 11/24/2015  . 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 Arthralgia 11/08/2011  . CALF PAIN, LEFT 07/16/2008  . MUSCLE STRAIN, LEFT CALF 07/16/2008    COCHRAN,JENNIFER, PTA 07/08/2020, 3:25 PM  [facility] [address] [place], [place], [address] Phone: [phone]   Fax:  [phone]  Name: Rontae Inglett MRN: [id number] Date of Birth: Jul 12, 1945

## 2020-07-13 ENCOUNTER — Ambulatory Visit: Payer: Medicare Other | Admitting: Physical Therapy

## 2020-07-13 ENCOUNTER — Other Ambulatory Visit: Payer: Self-pay

## 2020-07-13 ENCOUNTER — Encounter: Payer: Self-pay | Admitting: Physical Therapy

## 2020-07-13 DIAGNOSIS — M25612 Stiffness of left shoulder, not elsewhere classified: Secondary | ICD-10-CM

## 2020-07-13 DIAGNOSIS — M6281 Muscle weakness (generalized): Secondary | ICD-10-CM

## 2020-07-13 NOTE — Therapy (Signed)
[facility] [address] [place], [place], [address] Phone: [phone]   Fax:  [phone]  Physical Therapy Treatment  Patient Details  Name: Andre Parker MRN: [id number] Date of Birth: 07-16-1945 Referring Provider (PT): Francena Hanly MD   Encounter Date: 07/13/2020   PT End of Session - 07/13/20 1452    Visit Number 14    Date for PT Re-Evaluation 07/29/20    Authorization Type UHC Medicare    Progress Note Due on Visit 20    PT Start Time 1452    PT Stop Time 1530    PT Time Calculation (min) 38 min    Activity Tolerance Patient tolerated treatment well    Behavior During Therapy [facility] for tasks assessed/performed           Past Medical History:  Diagnosis Date  . Atrial fibrillation (HCC)   . Low back pain     Past Surgical History:  Procedure Laterality Date  . LUMBAR LAMINECTOMY    . ROTATOR CUFF REPAIR Right   . TONSILLECTOMY    . VASECTOMY      There were no vitals filed for this visit.   Subjective Assessment - 07/13/20 1453    Subjective Pt reports did well after last session and has had not had any increases in pain.    Currently in Pain? No/denies    Multiple Pain Sites No              [facility] PT Assessment - 07/13/20 0001      AROM   Left Shoulder Flexion 140 Degrees    Left Shoulder ABduction 160 Degrees    Left Shoulder Internal Rotation --   Reaches to almost midback: 1" below level of RT shoulder   Left Shoulder External Rotation --   88                        [facility] Adult PT Treatment/Exercise - 07/13/20 0001      Shoulder Exercises: Seated   Flexion Strengthening;Left;10 reps;Weights    Flexion Weight (lbs) 2   to 90 degrees flexion   Abduction Strengthening;Left;10 reps;Weights    ABduction Weight (lbs) 1    Diagonals Strengthening;Left;10 reps;Weights   2# 10x     Shoulder Exercises: Standing   ABduction AROM;Both;10 reps    Shoulder Elevation  Limitations Green loop static ER while pt slides hands up wall into flex 2x10    Other Standing Exercises Bicep in neutral and supinated  3# 2x15    Other Standing Exercises Red/Green  Rockwood 20x each with initial instructions   VC to keep body from rotating with ER: gave pt green band to update     Shoulder Exercises: ROM/Strengthening   UBE (Upper Arm Bike) L2.7 3x3 with PTA present to discuss current status.                    PT Short Term Goals - 06/22/20 1515      PT SHORT TERM GOAL #4   Title Pt will wean from sling (MD directed) and advance to AA/ROM with min pain not to exceed 3/10    Time 4    Period Weeks    Status Achieved    Target Date 06/03/20             PT Long Term Goals - 07/13/20 1520      PT LONG TERM GOAL #1   Title  Pt will achieve AA/ROM of Rt shoulder to at least 120 deg flexion, 90 deg abd, and 60 deg ER for improved UE functional use    Time 8    Period Weeks    Status Achieved                 Plan - 07/13/20 1452    Clinical Impression Statement Pt advancing nicely throughout his protocol, no pain or excessive fatigue. Advanced PREs today for deltoids and biceps with no issue. Did not advance any HEP today and will wait to see repsonse from todays exercises before we add them to his HEP. Current HEP may need some tweaking as it is getting long. Shoulder AROM WNL in all planes.    Examination-Activity Limitations Lift;Reach Overhead;Bathing;Transfers;Carry;Dressing    Examination-Participation Restrictions Cleaning;Yard Work;Shop;Laundry;Community Activity;Meal Prep    Stability/Clinical Decision Making Stable/Uncomplicated    Rehab Potential Excellent    PT Frequency 2x / week    PT Duration 12 weeks    PT Treatment/Interventions ADLs/Self Care Home Management;Cryotherapy;Electrical Stimulation;Moist Heat;Neuromuscular re-education;Therapeutic exercise;Therapeutic activities;Functional mobility training;Patient/family  education;Manual techniques;Passive range of motion;Joint Manipulations;Spinal Manipulations    PT Next Visit Plan Continue with protocol    PT Home Exercise Plan Pt already has pendulum ther ex from MD: [facility]    Consulted and Agree with Plan of Care Patient           Patient will benefit from skilled therapeutic intervention in order to improve the following deficits and impairments:  Decreased range of motion,Postural dysfunction,Decreased strength,Impaired UE functional use,Hypomobility,Decreased mobility  Visit Diagnosis: Stiffness of left shoulder, not elsewhere classified  Muscle weakness (generalized)     Problem List Patient Active Problem List   Diagnosis Date Noted  . SOB (shortness of breath) 09/09/2018  . Atrial fibrillation, chronic (HCC) 07/23/2018  . Pain in right knee 04/24/2018  . Medication management 04/18/2018  . Permanent atrial fibrillation (HCC) 10/10/2016  . Persistent atrial fibrillation (HCC) 09/11/2016  . RBBB 09/11/2016  . Need for vaccine for DT (diphtheria-tetanus) 01/25/2016  . Pain of left heel 11/25/2015  . Rib pain on right side 11/25/2015  . Compression fracture of L1 lumbar vertebra (HCC) 11/24/2015  . Gilbert's syndrome 11/24/2015  . Lower back pain 11/24/2015  . 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 Arthralgia 11/08/2011  . CALF PAIN, LEFT 07/16/2008  . MUSCLE STRAIN, LEFT CALF 07/16/2008    Kavin Weckwerth, PTA 07/13/2020, 3:33 PM  [facility] [address] [place], [place], [address] Phone: [phone]   Fax:  [phone]  Name: Andre Parker MRN: [id number] Date of Birth: 12-Jun-1946

## 2020-07-15 ENCOUNTER — Encounter: Payer: Self-pay | Admitting: Physical Therapy

## 2020-07-15 ENCOUNTER — Other Ambulatory Visit: Payer: Self-pay

## 2020-07-15 ENCOUNTER — Ambulatory Visit: Payer: Medicare Other | Attending: Orthopedic Surgery | Admitting: Physical Therapy

## 2020-07-15 DIAGNOSIS — M25612 Stiffness of left shoulder, not elsewhere classified: Secondary | ICD-10-CM | POA: Diagnosis present

## 2020-07-15 DIAGNOSIS — M6281 Muscle weakness (generalized): Secondary | ICD-10-CM | POA: Diagnosis present

## 2020-07-15 NOTE — Therapy (Signed)
[facility] [address], [address] [place], [place], [address] Phone: [phone]   Fax:  [phone]  Physical Therapy Treatment  Patient Details  Name: Andre Parker MRN: [id number] Date of Birth: 28-Aug-1945 Referring Provider (PT): Francena Hanly MD   Encounter Date: 07/15/2020   PT End of Session - 07/15/20 1456    Visit Number 15    Date for PT Re-Evaluation 07/29/20    Authorization Type UHC Medicare    PT Start Time 1456   pt late   PT Stop Time 1529    PT Time Calculation (min) 33 min    Activity Tolerance Patient tolerated treatment well    Behavior During Therapy [facility] for tasks assessed/performed           Past Medical History:  Diagnosis Date  . Atrial fibrillation (HCC)   . Low back pain     Past Surgical History:  Procedure Laterality Date  . LUMBAR LAMINECTOMY    . ROTATOR CUFF REPAIR Right   . TONSILLECTOMY    . VASECTOMY      There were no vitals filed for this visit.   Subjective Assessment - 07/15/20 1458    Subjective Having no issues with HEP, progressing PREs well and sensible.    Currently in Pain? No/denies    Multiple Pain Sites No                             [facility] Adult PT Treatment/Exercise - 07/15/20 0001      Shoulder Exercises: Standing   Flexion AROM;Strengthening;Both;15 reps   Full ROM overhead goal: VC to extend LT elbow more   ABduction --   Jumping jack arms 15xFull ROm encoiuraged   Shoulder Elevation Limitations Green loop static ER while pt slides hands up wall into flex 2x10   VC for IR technique   Other Standing Exercises Green Rockwood 15 xeach      Shoulder Exercises: Pulleys   Flexion 3 minutes   tissue prep/warm up with discussion of current status   Scaption 3 minutes      Shoulder Exercises: ROM/Strengthening   UBE (Upper Arm Bike) L2.8 3x3 at end of session    Pushups 5 reps   countertop 2 sets                   PT Short Term  Goals - 06/22/20 1515      PT SHORT TERM GOAL #4   Title Pt will wean from sling (MD directed) and advance to AA/ROM with min pain not to exceed 3/10    Time 4    Period Weeks    Status Achieved    Target Date 06/03/20             PT Long Term Goals - 07/13/20 1520      PT LONG TERM GOAL #1   Title Pt will achieve AA/ROM of Rt shoulder to at least 120 deg flexion, 90 deg abd, and 60 deg ER for improved UE functional use    Time 8    Period Weeks    Status Achieved                 Plan - 07/15/20 1516    Clinical Impression Statement Pt doing an excellent job progressing through protocol in addition to progressing his HEP in regards to resistance and reps/sets. pt demonstrates sound judgement in his choices with  HEP. Added closed chain countertop pushups to our session today which pt tolerated very well, no pain, and good control.    Examination-Activity Limitations Lift;Reach Overhead;Bathing;Transfers;Carry;Dressing    Examination-Participation Restrictions Cleaning;Yard Work;Shop;Laundry;Community Activity;Meal Prep    Stability/Clinical Decision Making Stable/Uncomplicated    Rehab Potential Excellent    PT Frequency 2x / week    PT Duration 12 weeks    PT Treatment/Interventions ADLs/Self Care Home Management;Cryotherapy;Electrical Stimulation;Moist Heat;Neuromuscular re-education;Therapeutic exercise;Therapeutic activities;Functional mobility training;Patient/family education;Manual techniques;Passive range of motion;Joint Manipulations;Spinal Manipulations    PT Next Visit Plan Continue with protocol    PT Home Exercise Plan Pt already has pendulum ther ex from MD: [facility]    Consulted and Agree with Plan of Care Patient           Patient will benefit from skilled therapeutic intervention in order to improve the following deficits and impairments:  Decreased range of motion,Postural dysfunction,Decreased strength,Impaired UE functional use,Hypomobility,Decreased  mobility  Visit Diagnosis: Stiffness of left shoulder, not elsewhere classified  Muscle weakness (generalized)     Problem List Patient Active Problem List   Diagnosis Date Noted  . SOB (shortness of breath) 09/09/2018  . Atrial fibrillation, chronic (HCC) 07/23/2018  . Pain in right knee 04/24/2018  . Medication management 04/18/2018  . Permanent atrial fibrillation (HCC) 10/10/2016  . Persistent atrial fibrillation (HCC) 09/11/2016  . RBBB 09/11/2016  . Need for vaccine for DT (diphtheria-tetanus) 01/25/2016  . Pain of left heel 11/25/2015  . Rib pain on right side 11/25/2015  . Compression fracture of L1 lumbar vertebra (HCC) 11/24/2015  . Gilbert's syndrome 11/24/2015  . Lower back pain 11/24/2015  . 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 Arthralgia 11/08/2011  . CALF PAIN, LEFT 07/16/2008  . MUSCLE STRAIN, LEFT CALF 07/16/2008    COCHRAN,JENNIFER, PTA 07/15/2020, 3:30 PM  [facility] [address] [place], [place], [address] Phone: [phone]   Fax:  [phone]  Name: Serafino Burciaga MRN: [id number] Date of Birth: 1945/08/13

## 2020-07-20 ENCOUNTER — Other Ambulatory Visit: Payer: Self-pay

## 2020-07-20 ENCOUNTER — Encounter: Payer: Self-pay | Admitting: Physical Therapy

## 2020-07-20 ENCOUNTER — Ambulatory Visit: Payer: Medicare Other | Admitting: Physical Therapy

## 2020-07-20 DIAGNOSIS — M25612 Stiffness of left shoulder, not elsewhere classified: Secondary | ICD-10-CM | POA: Diagnosis not present

## 2020-07-20 DIAGNOSIS — M6281 Muscle weakness (generalized): Secondary | ICD-10-CM

## 2020-07-20 NOTE — Therapy (Signed)
[facility] [address] [place], [place], [address] Phone: [phone]   Fax:  [phone]  Physical Therapy Treatment  Patient Details  Name: Andre Parker MRN: [id number] Date of Birth: 09-07-1945 Referring Provider (PT): Francena Hanly MD   Encounter Date: 07/20/2020   PT End of Session - 07/20/20 0936    Visit Number 16    Date for PT Re-Evaluation 07/29/20    Authorization Type UHC Medicare    Progress Note Due on Visit 20    PT Start Time 0934    PT Stop Time 1012    PT Time Calculation (min) 38 min    Activity Tolerance Patient tolerated treatment well    Behavior During Therapy [facility] for tasks assessed/performed           Past Medical History:  Diagnosis Date  . Atrial fibrillation (HCC)   . Low back pain     Past Surgical History:  Procedure Laterality Date  . LUMBAR LAMINECTOMY    . ROTATOR CUFF REPAIR Right   . TONSILLECTOMY    . VASECTOMY      There were no vitals filed for this visit.   Subjective Assessment - 07/20/20 0937    Subjective My shoulder is great.    Currently in Pain? No/denies    Multiple Pain Sites No                             [facility] Adult PT Treatment/Exercise - 07/20/20 0001      Shoulder Exercises: Standing   External Rotation Strengthening;Left;15 reps;Theraband   2 sets   Theraband Level (Shoulder External Rotation) Level 3 (Green)    Flexion Strengthening;Both;20 reps;Weights   2x10 1#   ABduction --   2x10 1# full ROM   Shoulder Elevation Limitations Green loop static ER while pt slides hands up wall into flex 2x15   VC for IR technique   Other Standing Exercises Bicep in neutral and supinated  4# 2x10   Vc for postural reminder   Other Standing Exercises Green loop CCK on wall static ER with LTUE add/abd 2x10      Shoulder Exercises: Pulleys   Flexion 3 minutes   tissue prep/warm up with discussion of current status   Scaption 3 minutes       Shoulder Exercises: ROM/Strengthening   UBE (Upper Arm Bike) L3 3x3      Shoulder Exercises: Power Tower   Row --   20# 20x   Other Power Tower Exercises 1 plate PNF D2 flexion 10x                    PT Short Term Goals - 06/22/20 1515      PT SHORT TERM GOAL #4   Title Pt will wean from sling (MD directed) and advance to AA/ROM with min pain not to exceed 3/10    Time 4    Period Weeks    Status Achieved    Target Date 06/03/20             PT Long Term Goals - 07/13/20 1520      PT LONG TERM GOAL #1   Title Pt will achieve AA/ROM of Rt shoulder to at least 120 deg flexion, 90 deg abd, and 60 deg ER for improved UE functional use    Time 8    Period Weeks    Status Achieved  Plan - 07/20/20 0936    Clinical Impression Statement Pt having no issues with his shoulder, no pain.  Pt reports he is using his arm/shoulder 100% for ADLS and light "lifting." He is not doing any heavy lifting ( using chainsaw, lifting logs) until he can speak with MD at the next visit if and when he can do these kinds of activities. Pt has return to MD on 08/03/20.    Examination-Activity Limitations Lift;Reach Overhead;Bathing;Transfers;Carry;Dressing    Examination-Participation Restrictions Cleaning;Yard Work;Shop;Laundry;Community Activity;Meal Prep    Stability/Clinical Decision Making Stable/Uncomplicated    Rehab Potential Excellent    PT Frequency 2x / week    PT Duration 12 weeks    PT Treatment/Interventions ADLs/Self Care Home Management;Cryotherapy;Electrical Stimulation;Moist Heat;Neuromuscular re-education;Therapeutic exercise;Therapeutic activities;Functional mobility training;Patient/family education;Manual techniques;Passive range of motion;Joint Manipulations;Spinal Manipulations    PT Next Visit Plan MMT next visit: work on pt holding object at 90 degrees or higher for time/endurance.    PT Home Exercise Plan Pt already has pendulum ther ex from  MD: [facility]    Consulted and Agree with Plan of Care Patient           Patient will benefit from skilled therapeutic intervention in order to improve the following deficits and impairments:  Decreased range of motion,Postural dysfunction,Decreased strength,Impaired UE functional use,Hypomobility,Decreased mobility  Visit Diagnosis: Stiffness of left shoulder, not elsewhere classified  Muscle weakness (generalized)     Problem List Patient Active Problem List   Diagnosis Date Noted  . SOB (shortness of breath) 09/09/2018  . Atrial fibrillation, chronic (HCC) 07/23/2018  . Pain in right knee 04/24/2018  . Medication management 04/18/2018  . Permanent atrial fibrillation (HCC) 10/10/2016  . Persistent atrial fibrillation (HCC) 09/11/2016  . RBBB 09/11/2016  . Need for vaccine for DT (diphtheria-tetanus) 01/25/2016  . Pain of left heel 11/25/2015  . Rib pain on right side 11/25/2015  . Compression fracture of L1 lumbar vertebra (HCC) 11/24/2015  . Gilbert's syndrome 11/24/2015  . Lower back pain 11/24/2015  . 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 Arthralgia 11/08/2011  . CALF PAIN, LEFT 07/16/2008  . MUSCLE STRAIN, LEFT CALF 07/16/2008    Shelvy Perazzo, pTA 07/20/2020, 10:14 AM  [facility] [address] [place], [place], [address] Phone: [phone]   Fax:  [phone]  Name: Andre Parker MRN: [id number] Date of Birth: January 09, 1946

## 2020-07-24 ENCOUNTER — Encounter: Payer: Self-pay | Admitting: Physical Therapy

## 2020-07-24 ENCOUNTER — Other Ambulatory Visit: Payer: Self-pay

## 2020-07-24 ENCOUNTER — Ambulatory Visit: Payer: Medicare Other | Admitting: Physical Therapy

## 2020-07-24 DIAGNOSIS — M25612 Stiffness of left shoulder, not elsewhere classified: Secondary | ICD-10-CM | POA: Diagnosis not present

## 2020-07-24 DIAGNOSIS — M6281 Muscle weakness (generalized): Secondary | ICD-10-CM

## 2020-07-24 NOTE — Therapy (Signed)
[facility] [address], [address] [place], [place], [address] Phone: [phone]   Fax:  [phone]  Physical Therapy Treatment  Patient Details  Name: Andre Parker MRN: [id number] Date of Birth: 09-16-1945 Referring Provider (PT): Francena Hanly MD   Encounter Date: 07/24/2020   PT End of Session - 07/24/20 1012    Visit Number 17    Date for PT Re-Evaluation 07/29/20    Authorization Type UHC Medicare    Progress Note Due on Visit 20    PT Start Time 1012    PT Stop Time 1056    PT Time Calculation (min) 44 min    Activity Tolerance Patient tolerated treatment well    Behavior During Therapy [facility] for tasks assessed/performed           Past Medical History:  Diagnosis Date  . Atrial fibrillation (HCC)   . Low back pain     Past Surgical History:  Procedure Laterality Date  . LUMBAR LAMINECTOMY    . ROTATOR CUFF REPAIR Right   . TONSILLECTOMY    . VASECTOMY      There were no vitals filed for this visit.   Subjective Assessment - 07/24/20 1014    Subjective No complaints    Currently in Pain? No/denies    Multiple Pain Sites No              [facility] PT Assessment - 07/24/20 0001      Strength   Left Shoulder Flexion 5/5    Left Shoulder ABduction 5/5    Left Shoulder Internal Rotation 5/5    Left Shoulder External Rotation 4+/5                         [facility] Adult PT Treatment/Exercise - 07/24/20 0001      Shoulder Exercises: Standing   External Rotation Strengthening;Left   3x10, TC/VC to stabilize trunk   ABduction Strengthening;Both;10 reps;Weights   2# 2 sets   Shoulder Elevation Limitations Body Blade at 90 degrees 3x 30 sec    Other Standing Exercises Green loop on wrist; static stretch as pt slides arms up th ewall 2x20    Other Standing Exercises 1# at 90 degrees, circles both directio n10x2      Shoulder Exercises: ROM/Strengthening   UBE (Upper Arm Bike) L3 3x3 for warm  up/tissue prep    Lat Pull --   3 plates 30Q, 4 plates 76A   Wall Pushups Limitations In wall push  up position with green loop around the wrist; LT arm horizontally abducts / strteching band more 2x10                    PT Short Term Goals - 06/22/20 1515      PT SHORT TERM GOAL #4   Title Pt will wean from sling (MD directed) and advance to AA/ROM with min pain not to exceed 3/10    Time 4    Period Weeks    Status Achieved    Target Date 06/03/20             PT Long Term Goals - 07/13/20 1520      PT LONG TERM GOAL #1   Title Pt will achieve AA/ROM of Rt shoulder to at least 120 deg flexion, 90 deg abd, and 60 deg ER for improved UE functional use    Time 8    Period Weeks  Status Achieved                 Plan - 07/24/20 1016    Clinical Impression Statement Pt can reach behind his back with no significant issues. MMT grossly 5/5 except external rotation which was just slightly weaker. Pt can do all ADLS. Pt not having any pain, occasional "arm ache." Pt may be candidate for early discharge.    Examination-Activity Limitations Lift;Reach Overhead;Bathing;Transfers;Carry;Dressing    Examination-Participation Restrictions Cleaning;Yard Work;Shop;Laundry;Community Activity;Meal Prep    Stability/Clinical Decision Making Stable/Uncomplicated    Rehab Potential Excellent    PT Frequency 2x / week    PT Duration 12 weeks    PT Treatment/Interventions ADLs/Self Care Home Management;Cryotherapy;Electrical Stimulation;Moist Heat;Neuromuscular re-education;Therapeutic exercise;Therapeutic activities;Functional mobility training;Patient/family education;Manual techniques;Passive range of motion;Joint Manipulations;Spinal Manipulations    PT Next Visit Plan Assess for DC plan, discuss with Pt. FOTO??    PT Home Exercise Plan Pt already has pendulum ther ex from MD: [facility]    Consulted and Agree with Plan of Care Patient           Patient will benefit from  skilled therapeutic intervention in order to improve the following deficits and impairments:  Decreased range of motion,Postural dysfunction,Decreased strength,Impaired UE functional use,Hypomobility,Decreased mobility  Visit Diagnosis: Stiffness of left shoulder, not elsewhere classified  Muscle weakness (generalized)     Problem List Patient Active Problem List   Diagnosis Date Noted  . SOB (shortness of breath) 09/09/2018  . Atrial fibrillation, chronic (HCC) 07/23/2018  . Pain in right knee 04/24/2018  . Medication management 04/18/2018  . Permanent atrial fibrillation (HCC) 10/10/2016  . Persistent atrial fibrillation (HCC) 09/11/2016  . RBBB 09/11/2016  . Need for vaccine for DT (diphtheria-tetanus) 01/25/2016  . Pain of left heel 11/25/2015  . Rib pain on right side 11/25/2015  . Compression fracture of L1 lumbar vertebra (HCC) 11/24/2015  . Gilbert's syndrome 11/24/2015  . Lower back pain 11/24/2015  . 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 Arthralgia 11/08/2011  . CALF PAIN, LEFT 07/16/2008  . MUSCLE STRAIN, LEFT CALF 07/16/2008    Bryla Burek, PTA 07/24/2020, 10:57 AM  [facility] [address], [address] [place], [place], [address] Phone: [phone]   Fax:  [phone]  Name: Andre Parker MRN: [id number] Date of Birth: 1946-04-28

## 2020-07-28 ENCOUNTER — Ambulatory Visit: Payer: 59 | Admitting: Physical Therapy

## 2020-07-29 ENCOUNTER — Other Ambulatory Visit: Payer: Self-pay

## 2020-07-29 ENCOUNTER — Ambulatory Visit: Payer: Medicare Other | Admitting: Physical Therapy

## 2020-07-29 ENCOUNTER — Encounter: Payer: Self-pay | Admitting: Physical Therapy

## 2020-07-29 DIAGNOSIS — M25612 Stiffness of left shoulder, not elsewhere classified: Secondary | ICD-10-CM | POA: Diagnosis not present

## 2020-07-29 DIAGNOSIS — M6281 Muscle weakness (generalized): Secondary | ICD-10-CM

## 2020-07-29 NOTE — Therapy (Addendum)
[facility] [address], [place], [place], [address] Phone: [phone]   Fax:  [phone]  Physical Therapy Treatment  Patient Details  Name: Andre Parker MRN: [id number] Date of Birth: 02/21/1946 Referring Provider (PT): Justice Britain MD   Encounter Date: 07/29/2020   PT End of Session - 07/29/20 1449    Visit Number 18    Date for PT Re-Evaluation 07/29/20    Authorization Type UHC Medicare    Progress Note Due on Visit 20    PT Start Time 1446    PT Stop Time 1525    PT Time Calculation (min) 39 min    Activity Tolerance Patient tolerated treatment well    Behavior During Therapy [facility] for tasks assessed/performed           Past Medical History:  Diagnosis Date  . Atrial fibrillation ([place])   . Low back pain     Past Surgical History:  Procedure Laterality Date  . LUMBAR LAMINECTOMY    . ROTATOR CUFF REPAIR Right   . TONSILLECTOMY    . VASECTOMY      There were no vitals filed for this visit.   Subjective Assessment - 07/29/20 1451    Subjective I am doing great. I'm sorry I over slept last time    Currently in Pain? No/denies              [facility] PT Assessment - 07/29/20 0001      Observation/Other Assessments   Focus on Therapeutic Outcomes (FOTO)  92      AROM   Left Shoulder Flexion --   150   Left Shoulder ABduction --   160   Left Shoulder Internal Rotation --   Reaches to almost midback: 1" below level of RT shoulder   Left Shoulder External Rotation --   Touches lower cervical     Strength   Left Shoulder Flexion 5/5    Left Shoulder ABduction 5/5    Left Shoulder Internal Rotation 5/5    Left Shoulder External Rotation 4+/5                                 PT Education - 07/29/20 1514    Education Details REVIEW OF HEP, including how to incorporate his core in all his exercises.    Person(s) Educated Patient    Methods Demonstration    Comprehension  Returned demonstration;Verbalized understanding            PT Short Term Goals - 06/22/20 1515      PT SHORT TERM GOAL #4   Title Pt will wean from sling (MD directed) and advance to AA/ROM with min pain not to exceed 3/10    Time 4    Period Weeks    Status Achieved    Target Date 06/03/20             PT Long Term Goals - 07/29/20 1501      PT LONG TERM GOAL #1   Title Pt will achieve AA/ROM of Rt shoulder to at least 120 deg flexion, 90 deg abd, and 60 deg ER for improved UE functional use    Time 8    Period Weeks    Status Achieved      PT LONG TERM GOAL #2   Title Pt will achieve scapular stabilizer strength of at least 4/5 for good shoulder mechanics with  reaching    Time 10    Period Weeks    Status Achieved      PT LONG TERM GOAL #3   Title Pt will achieve at least 4/5 strength of Rt shoulder cardinal planes for improved UE use with ADLs.    Time 12    Period Weeks    Status Achieved      PT LONG TERM GOAL #4   Title Pt will reduce FOTO to </= 44% to demo less limitation    Period Weeks    Status Achieved      PT LONG TERM GOAL #5   Title Pt will achieve functional shoulder extension for grooming and dressing with min or no pain    Time 12    Period Weeks    Status Achieved                 Plan - 07/29/20 1450    Clinical Impression Statement All long term goals met, MMT 5/5 except shoulder Ext Rotation 4+/5. Pt doing all ADLS. Pt will ask MD about using the chain saw. Pt essentially has no pain but feels an occasional "tendon moving or popping." FOTO score also met. Pt agreeable to discharge today to HEP.    Examination-Activity Limitations Lift;Reach Overhead;Bathing;Transfers;Carry;Dressing    Examination-Participation Restrictions Cleaning;Yard Work;Shop;Laundry;Community Activity;Meal Prep    Stability/Clinical Decision Making Stable/Uncomplicated    Rehab Potential Excellent    PT Frequency 2x / week    PT Duration 12 weeks    PT  Treatment/Interventions ADLs/Self Care Home Management;Cryotherapy;Electrical Stimulation;Moist Heat;Neuromuscular re-education;Therapeutic exercise;Therapeutic activities;Functional mobility training;Patient/family education;Manual techniques;Passive range of motion;Joint Manipulations;Spinal Manipulations    PT Next Visit Plan DC to HEP    PT Home Exercise Plan Pt already has pendulum ther ex from MD: [facility]    Consulted and Agree with Plan of Care Patient           Patient will benefit from skilled therapeutic intervention in order to improve the following deficits and impairments:  Decreased range of motion,Postural dysfunction,Decreased strength,Impaired UE functional use,Hypomobility,Decreased mobility  Visit Diagnosis: Stiffness of left shoulder, not elsewhere classified  Muscle weakness (generalized)     Problem List Patient Active Problem List   Diagnosis Date Noted  . SOB (shortness of breath) 09/09/2018  . Atrial fibrillation, chronic ([place]) 07/23/2018  . Pain in right knee 04/24/2018  . Medication management 04/18/2018  . Permanent atrial fibrillation ([place]) 10/10/2016  . Persistent atrial fibrillation ([place]) 09/11/2016  . RBBB 09/11/2016  . Need for vaccine for DT (diphtheria-tetanus) 01/25/2016  . Pain of left heel 11/25/2015  . Rib pain on right side 11/25/2015  . Compression fracture of L1 lumbar vertebra ([place]) 11/24/2015  . Gilbert's syndrome 11/24/2015  . Lower back pain 11/24/2015  . 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 Arthralgia 11/08/2011  . CALF PAIN, LEFT 07/16/2008  . MUSCLE STRAIN, LEFT CALF 07/16/2008   Myrene Galas, PTA 07/29/20 3:27 PM  07/29/2020, 3:26 PM  PHYSICAL THERAPY DISCHARGE SUMMARY  Visits from Start of Care: 18  Current functional level related to goals / functional outcomes: See above   Remaining deficits: See above   Education / Equipment: HEP Plan: Patient agrees to discharge.   Patient goals were met. Patient is being discharged due to meeting the stated rehab goals.  ?????         Baruch Merl, PT 07/30/20 4:30 PM   [facility] [address],  [place], [place], [address] Phone: [phone]   Fax:  [phone]  Name: Andre Parker MRN: [id number] Date of Birth: 1946-02-05

## 2020-07-31 ENCOUNTER — Encounter: Payer: 59 | Admitting: Physical Therapy

## 2020-08-04 ENCOUNTER — Encounter: Payer: 59 | Admitting: Physical Therapy

## 2020-08-07 ENCOUNTER — Encounter: Payer: 59 | Admitting: Physical Therapy

## 2020-08-11 ENCOUNTER — Encounter: Payer: 59 | Admitting: Physical Therapy

## 2020-08-14 ENCOUNTER — Encounter: Payer: 59 | Admitting: Physical Therapy

## 2020-08-18 ENCOUNTER — Encounter: Payer: 59 | Admitting: Physical Therapy

## 2020-08-21 ENCOUNTER — Encounter: Payer: 59 | Admitting: Physical Therapy

## 2021-04-01 NOTE — Progress Notes (Signed)
Cardiology Office Note   Date:  04/02/2021   ID:  Aleister Lady, DOB 1945-12-04, MRN [id number]  PCP:  Roger Kill, PA-C  Cardiologist:   Rollene Rotunda, MD    Chief Complaint  Patient presents with   Atrial Fibrillation       History of Present Illness: Andre Parker is a 75 y.o. male who presents for evaluation of atrial fib.  He has had a Holter with persistent atrial fib with good rate control.  His echo was normal.   Stress perfusion study was normal om 2020.    Since I last saw him he has done well.  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  He still walks with dogs.  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  He was a high level of college athlete.  The patient denies any new symptoms such as chest discomfort, neck or arm discomfort. There has been no new shortness of breath, PND or orthopnea. There have been no reported palpitations, presyncope or syncope.  He does not really notice his fibrillation.  Past Medical History:  Diagnosis Date   Atrial fibrillation (HCC)    Low back pain     Past Surgical History:  Procedure Laterality Date   LUMBAR LAMINECTOMY     ROTATOR CUFF REPAIR Right    TONSILLECTOMY     VASECTOMY       Current Outpatient Medications  Medication Sig Dispense Refill   acetaminophen (TYLENOL) 325 MG tablet Take 325 mg by mouth every 6 (six) hours as needed.     apixaban (ELIQUIS) 5 MG TABS tablet Take 1 tablet (5 mg total) by mouth 2 (two) times daily. 180 tablet 3   Ascorbic Acid (VITAMIN C PO) Take 500 mg by mouth 2 (two) times daily.      atorvastatin (LIPITOR) 40 MG tablet Take 40 mg by mouth daily.     Calcium Citrate (CAL-CITRATE PO) Take 250 mg by mouth 3 (three) times daily.      chlorpheniramine (CHLOR-TRIMETON) 4 MG tablet Take 4 mg by mouth as needed for allergies.     Cholecalciferol (VITAMIN D3) 1000 units CAPS Take 1 capsule by mouth daily.      ferrous sulfate 325 (65 FE) MG tablet Take 325 mg  by mouth daily with breakfast.     Magnesium 250 MG TABS Take 1 tablet by mouth daily.     Melatonin-Pyridoxine (MELATIN PO) Take by mouth.     Multiple Vitamin (MULTIVITAMIN) tablet Take 1 tablet by mouth daily.     No current facility-administered medications for this visit.    Allergies:   Patient has no known allergies.    ROS:  Please see the history of present illness.   Otherwise, review of systems are positive none.   All other systems are reviewed and negative.    PHYSICAL EXAM: VS:  BP 130/80   Pulse 78   Ht 5\' 10"  (1.778 m)   Wt 170 lb 3.2 oz (77.2 kg)   SpO2 99%   BMI 24.42 kg/m  , BMI Body mass index is 24.42 kg/m.  GENERAL:  Well appearing NECK:  No jugular venous distention, waveform within normal limits, carotid upstroke brisk and symmetric, no bruits, no thyromegaly LUNGS:  Clear to auscultation bilaterally CHEST:  Unremarkable HEART:  PMI not displaced or sustained,S1 and S2 within normal limits, no S3, no clicks, no rubs, no murmurs, irregular  ABD:  Flat, positive bowel  sounds normal in frequency in pitch, no bruits, no rebound, no guarding, no midline pulsatile mass, no hepatomegaly, no splenomegaly EXT:  2 plus pulses throughout, no edema, no cyanosis no clubbing   EKG:  EKG is  ordered today. Atrial fibrillation, right bundle branch block, rate 78, no change from previous.  Recent Labs: 04/07/2020: Hemoglobin 17.2; Platelets 179    Lipid Panel No results found for: CHOL, TRIG, HDL, CHOLHDL, VLDL, LDLCALC, LDLDIRECT    Wt Readings from Last 3 Encounters:  04/02/21 170 lb 3.2 oz (77.2 kg)  04/07/20 166 lb (75.3 kg)  03/25/19 166 lb 3.2 oz (75.4 kg)      Other studies Reviewed: Additional studies/ records that were reviewed today include: Labs Review of the above records demonstrates:   See elsewhere   ASSESSMENT AND PLAN:   ATRIAL FIB:  Andre Parker has a CHA2DS2 - VASc score of  2.  He has good rate control.  I will check a CBC.  He  tolerates anticoagulation.  I do see a basic metabolic profile and he remains on the correct dose.   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  He has no symptomatic bradycardia arrhythmias.  Therapy. Current medicines are reviewed at length with the patient today.  The patient does not have concerns regarding medicines.  The following changes have been made:   None  Labs/ tests ordered today include:   Orders Placed This Encounter  Procedures   CBC with Differential/Platelet   EKG 12-Lead      Disposition:   FU with me in 12 months.    Signed, Rollene Rotunda, MD  04/02/2021 11:27 AM    [facility]

## 2021-04-02 ENCOUNTER — Other Ambulatory Visit: Payer: Self-pay

## 2021-04-02 ENCOUNTER — Ambulatory Visit (INDEPENDENT_AMBULATORY_CARE_PROVIDER_SITE_OTHER): Payer: 59 | Admitting: Cardiology

## 2021-04-02 ENCOUNTER — Encounter: Payer: Self-pay | Admitting: Cardiology

## 2021-04-02 VITALS — BP 130/80 | HR 78 | Ht 70.0 in | Wt 170.2 lb

## 2021-04-02 DIAGNOSIS — Z5181 Encounter for therapeutic drug level monitoring: Secondary | ICD-10-CM

## 2021-04-02 DIAGNOSIS — I4819 Other persistent atrial fibrillation: Secondary | ICD-10-CM

## 2021-04-02 LAB — CBC WITH DIFFERENTIAL/PLATELET
Basophils Absolute: 0 10*3/uL (ref 0.0–0.2)
Basos: 1 %
EOS (ABSOLUTE): 0.1 10*3/uL (ref 0.0–0.4)
Eos: 2 %
Hematocrit: 46.7 % (ref 37.5–51.0)
Hemoglobin: 16.3 g/dL (ref 13.0–17.7)
Immature Grans (Abs): 0 10*3/uL (ref 0.0–0.1)
Immature Granulocytes: 0 %
Lymphocytes Absolute: 1.5 10*3/uL (ref 0.7–3.1)
Lymphs: 31 %
MCH: 34.5 pg — ABNORMAL HIGH (ref 26.6–33.0)
MCHC: 34.9 g/dL (ref 31.5–35.7)
MCV: 99 fL — ABNORMAL HIGH (ref 79–97)
Monocytes Absolute: 0.9 10*3/uL (ref 0.1–0.9)
Monocytes: 19 %
Neutrophils Absolute: 2.3 10*3/uL (ref 1.4–7.0)
Neutrophils: 47 %
Platelets: 167 10*3/uL (ref 150–450)
RBC: 4.72 x10E6/uL (ref 4.14–5.80)
RDW: 11.9 % (ref 11.6–15.4)
WBC: 4.8 10*3/uL (ref 3.4–10.8)

## 2021-04-02 NOTE — Patient Instructions (Signed)
Medication Instructions:  Your physician recommends that you continue on your current medications as directed. Please refer to the Current Medication list given to you today.   *If you need a refill on your cardiac medications before your next appointment, please call your pharmacy*  Lab Work: CBC TODAY   If you have labs (blood work) drawn today and your tests are completely normal, you will receive your results only by: MyChart Message (if you have MyChart) OR A paper copy in the mail If you have any lab test that is abnormal or we need to change your treatment, we will call you to review the results.  Testing/Procedures: NONE   Follow-Up: At [facility], you and your health needs are our priority.  As part of our continuing mission to provide you with exceptional heart care, we have created designated Provider Care Teams.  These Care Teams include your primary Cardiologist (physician) and Advanced Practice Providers (APPs -  Physician Assistants and Nurse Practitioners) who all work together to provide you with the care you need, when you need it.  We recommend signing up for the patient portal called "MyChart".  Sign up information is provided on this After Visit Summary.  MyChart is used to connect with patients for Virtual Visits (Telemedicine).  Patients are able to view lab/test results, encounter notes, upcoming appointments, etc.  Non-urgent messages can be sent to your provider as well.   To learn more about what you can do with MyChart, go to [email or web address].    Your next appointment:   12 month(s)  The format for your next appointment:   In Person  Provider:   You may see Rollene Rotunda, MD or one of the following Advanced Practice Providers on your designated Care Team:   Theodore Demark, PA-C Juanda Crumble, PA-C Joni Reining, DNP, ANP

## 2021-07-13 ENCOUNTER — Other Ambulatory Visit: Payer: Self-pay | Admitting: Cardiology

## 2021-07-13 NOTE — Telephone Encounter (Signed)
Prescription refill request for Eliquis received. Indication: afib  Last office visit: Hochrein, 04/02/2021 Scr: 0.87, 12/29/2020 Age: 76 yo  Weight: 77.2 kg   Refill sent.

## 2021-12-23 ENCOUNTER — Telehealth: Payer: Self-pay

## 2021-12-23 NOTE — Telephone Encounter (Signed)
   Pre-operative Risk Assessment    Patient Name: Andre Parker  DOB: 11-Sep-1945 MRN: [id number]      Request for Surgical Clearance    Procedure:   Colonoscopy  Date of Surgery:  Clearance 02/16/22                                 Surgeon:  Dr. Matthias Hughs Surgeon's Group or Practice Name:  [facility] Phone number:  [phone] Fax number:  [phone]   Type of Clearance Requested:      Type of Anesthesia:   propofol   Additional requests/questions:    Signed, Donneta Romberg   12/23/2021, 11:13 AM

## 2022-01-04 ENCOUNTER — Other Ambulatory Visit: Payer: Self-pay | Admitting: Cardiology

## 2022-01-04 DIAGNOSIS — I4819 Other persistent atrial fibrillation: Secondary | ICD-10-CM

## 2022-01-05 NOTE — Telephone Encounter (Signed)
Prescription refill request for Eliquis received.  Indication: afib  Last office visit: Hochrein, 04/02/2021 Scr:  12/29/2020, 0.87 Age: 76 yo  Weight: 77.2 kg  Pt is overdue for blood work.

## 2022-01-05 NOTE — Telephone Encounter (Signed)
Called and spoke to pt. Informed him we would need updated blood work. Pt stated that he would come in the next week or two for blood work. Put in order for and CBC and BMET.   Informed pt that I would send in a 1 month supply so he does not run out.

## 2022-01-07 LAB — CBC
Hematocrit: 45.3 % (ref 37.5–51.0)
Hemoglobin: 15.7 g/dL (ref 13.0–17.7)
MCH: 34.4 pg — ABNORMAL HIGH (ref 26.6–33.0)
MCHC: 34.7 g/dL (ref 31.5–35.7)
MCV: 99 fL — ABNORMAL HIGH (ref 79–97)
Platelets: 165 10*3/uL (ref 150–450)
RBC: 4.57 x10E6/uL (ref 4.14–5.80)
RDW: 12 % (ref 11.6–15.4)
WBC: 4 10*3/uL (ref 3.4–10.8)

## 2022-01-08 LAB — BASIC METABOLIC PANEL
BUN/Creatinine Ratio: 23 (ref 10–24)
BUN: 21 mg/dL (ref 8–27)
CO2: 21 mmol/L (ref 20–29)
Calcium: 8.8 mg/dL (ref 8.6–10.2)
Chloride: 105 mmol/L (ref 96–106)
Creatinine, Ser: 0.91 mg/dL (ref 0.76–1.27)
Glucose: 100 mg/dL — ABNORMAL HIGH (ref 70–99)
Potassium: 4.8 mmol/L (ref 3.5–5.2)
Sodium: 142 mmol/L (ref 134–144)
eGFR: 87 mL/min/{1.73_m2} (ref >59)

## 2022-02-04 ENCOUNTER — Other Ambulatory Visit: Payer: Self-pay | Admitting: Cardiology

## 2022-02-07 NOTE — Telephone Encounter (Signed)
Prescription refill request for Eliquis received. Indication:Afib Last office visit:10/22 Scr:0.9 Age: 76 Weight:77.2 kg  Prescription refilled

## 2022-02-09 ENCOUNTER — Telehealth: Payer: Self-pay | Admitting: Cardiology

## 2022-02-09 NOTE — Telephone Encounter (Signed)
Will route to PharmD for rec's re: holding anticoagulation. Tereso Newcomer, PA-C    02/09/2022 4:13 PM

## 2022-02-09 NOTE — Telephone Encounter (Signed)
   Pre-operative Risk Assessment    Patient Name: Andre Parker  DOB: 07-20-1945 MRN: [id number]      Request for Surgical Clearance    Procedure:  Colonoscopy  Date of Surgery:  Clearance 02/16/22                                 Surgeon:  Dr. Matthias Hughs  Surgeon's Group or Practice Name:  [facility] Phone number:  [phone] Fax number:  [phone]   Type of Clearance Requested:   - Medical  - Pharmacy:  Hold Apixaban (Eliquis) How long to hold before and after   Type of Anesthesia:   Propofol   Additional requests/questions:   [facility] called back on 08/30 to get update on this and I saw that it had not been routed to Pre-Op previously from 07/13 clearance. Created new clearance and added addition information from [place] at [facility] .   Signed, April Henson   02/09/2022, 3:09 PM

## 2022-02-10 NOTE — Telephone Encounter (Signed)
Patient with diagnosis of afib on Eliquis for anticoagulation.    Procedure: colonoscopy Date of procedure: 02/16/22  CHA2DS2-VASc Score = 2   This indicates a 2.2% annual risk of stroke. The patient's score is based upon: CHF History: 0 HTN History: 0 Diabetes History: 0 Stroke History: 0 Vascular Disease History: 0 Age Score: 2 Gender Score: 0      CrCl 71.81  Per office protocol, patient can hold Eliquis for 2 days prior to procedure.    **This guidance is not considered finalized until pre-operative APP has relayed final recommendations.**

## 2022-02-11 NOTE — Telephone Encounter (Signed)
   Name: Audrey Eller  DOB: 20-Jan-1946  MRN: [id number]   Primary Cardiologist: Rollene Rotunda, MD  Chart reviewed as part of pre-operative protocol coverage. Patient was contacted 02/11/2022 in reference to pre-operative risk assessment for pending surgery as outlined below.  Reynol Arnone was last seen on 04/02/2021 by Dr. Antoine Poche.  Since that day, Aniel Hubble has done well without exertional chest pain or worsening dyspnea.  Therefore, based on ACC/AHA guidelines, the patient would be at acceptable risk for the planned procedure without further cardiovascular testing.   Patient is cleared to proceed with GI procedure without further work-up.  He has been instructed to hold Eliquis for 2 days prior to the procedure and restart as once possible afterward at the discretion of his GI doctor.  The patient was advised that if he develops new symptoms prior to surgery to contact our office to arrange for a follow-up visit, and he verbalized understanding.  I will route this recommendation to the requesting party via Epic fax function and remove from pre-op pool. Please call with questions.  [place], [place] 02/11/2022, 10:27 AM

## 2022-02-11 NOTE — Telephone Encounter (Signed)
[facility] is calling back needing cardiac clearance and Eliquis cease instructions a.s.a.p due to the procedure being scheduled for 02/16/22.  They state that if they wait past this day it will have to be rescheduled. Please advise.

## 2022-02-11 NOTE — Telephone Encounter (Signed)
I will forward this to pre op pool.  °

## 2022-04-14 NOTE — Progress Notes (Signed)
Cardiology Office Note   Date:  04/15/2022   ID:  Andre Parker, DOB [birth year], MRN [id number]  PCP:  Heywood Bene, PA-C  Cardiologist:   Minus Breeding, MD    Chief Complaint  Patient presents with   Atrial Fibrillation       History of Present Illness: Andre Parker is a 76 y.o. male who presents for evaluation of atrial fib.  He has had a Holter with persistent atrial fib with good rate control.  His echo was normal.   Stress perfusion study was normal om 2020.    Since I last saw him he has done okay from a cardiac standpoint.  He does not notice his atrial fibrillation.  He denies any palpitations, presyncope or syncope.  He tolerates his anticoagulation and has had no problems.  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  He has been limited now by his hip and he is going to get replacement.  He denies any chest pressure, neck or arm discomfort.  He has had no new shortness of breath, PND or orthopnea.  Past Medical History:  Diagnosis Date   Atrial fibrillation (HCC)    Low back pain     Past Surgical History:  Procedure Laterality Date   LUMBAR LAMINECTOMY     ROTATOR CUFF REPAIR Right    TONSILLECTOMY     VASECTOMY       Current Outpatient Medications  Medication Sig Dispense Refill   acetaminophen (TYLENOL) 325 MG tablet Take 325 mg by mouth every 6 (six) hours as needed.     apixaban (ELIQUIS) 5 MG TABS tablet Take 1 tablet (5 mg total) by mouth 2 (two) times daily. 60 tablet 5   Ascorbic Acid (VITAMIN C PO) Take 500 mg by mouth 2 (two) times daily.      atorvastatin (LIPITOR) 40 MG tablet Take 40 mg by mouth daily.     Calcium Citrate (CAL-CITRATE PO) Take 250 mg by mouth 3 (three) times daily.      chlorpheniramine (CHLOR-TRIMETON) 4 MG tablet Take 4 mg by mouth as needed for allergies.     Cholecalciferol (VITAMIN D3) 1000 units CAPS Take 1 capsule by mouth daily.      ferrous sulfate 325 (65 FE) MG tablet Take 325  mg by mouth daily with breakfast.     Magnesium 250 MG TABS Take 1 tablet by mouth daily.     Melatonin-Pyridoxine (MELATIN PO) Take by mouth.     Multiple Vitamin (MULTIVITAMIN) tablet Take 1 tablet by mouth daily.     No current facility-administered medications for this visit.    Allergies:   Patient has no known allergies.    ROS:  Please see the history of present illness.   Otherwise, review of systems are positive none.   All other systems are reviewed and negative.    PHYSICAL EXAM: VS:  BP 100/70   Pulse 92   Ht 5\' 10"  (1.778 m)   Wt 167 lb 3.2 oz (75.8 kg)   SpO2 93%   BMI 23.99 kg/m  , BMI Body mass index is 23.99 kg/m.  GENERAL:  Well appearing NECK:  No jugular venous distention, waveform within normal limits, carotid upstroke brisk and symmetric, no bruits, no thyromegaly LUNGS:  Clear to auscultation bilaterally CHEST:  Unremarkable HEART:  PMI not displaced or sustained,S1 and S2 within normal limits,no clicks, no rubs, no murmurs, irregular ABD:  Flat, positive  bowel sounds normal in frequency in pitch, no bruits, no rebound, no guarding, no midline pulsatile mass, no hepatomegaly, no splenomegaly EXT:  2 plus pulses throughout, no edema, no cyanosis no clubbing   EKG:  EKG is  ordered today. Atrial fibrillation, right bundle branch block, rate 89, no change from previous.  Recent Labs: 01/07/2022: BUN 21; Creatinine, Ser 0.91; Hemoglobin 15.7; Platelets 165; Potassium 4.8; Sodium 142    Lipid Panel No results found for: "CHOL", "TRIG", "HDL", "CHOLHDL", "VLDL", "LDLCALC", "LDLDIRECT"    Wt Readings from Last 3 Encounters:  04/15/22 167 lb 3.2 oz (75.8 kg)  04/02/21 170 lb 3.2 oz (77.2 kg)  04/07/20 166 lb (75.3 kg)      Other studies Reviewed: Additional studies/ records that were reviewed today include: Labs Review of the above records demonstrates:   See elsewhere   ASSESSMENT AND PLAN:   ATRIAL FIB:  [name] has a CHA2DS2 - VASc  score of  2.   He is up-to-date with hemoglobin and CBC.  No change in therapy.  He has had good rate control.  He has been asymptomatic.  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  No change in therapy.   PREOP: The patient has a high functional level.  He is not going for high risk procedure.  He has no unstable symptoms.  No change in therapy.  ANTICOAGULATION:    He is OK to hold the Eliqis for 3 days prior to surgery.    Current medicines are reviewed at length with the patient today.  The patient does not have concerns regarding medicines.  The following changes have been made:   None  Labs/ tests ordered today include:   Orders Placed This Encounter  Procedures   EKG 12-Lead      Disposition:   FU with me in 12 months.    Signed, Rollene Rotunda, MD  04/15/2022 11:26 AM    [facility]

## 2022-04-15 ENCOUNTER — Ambulatory Visit: Payer: Medicare Other | Attending: Cardiology | Admitting: Cardiology

## 2022-04-15 ENCOUNTER — Encounter: Payer: Self-pay | Admitting: Cardiology

## 2022-04-15 VITALS — BP 100/70 | HR 92 | Ht 70.0 in | Wt 167.2 lb

## 2022-04-15 DIAGNOSIS — Z01810 Encounter for preprocedural cardiovascular examination: Secondary | ICD-10-CM | POA: Diagnosis not present

## 2022-04-15 DIAGNOSIS — D6869 Other thrombophilia: Secondary | ICD-10-CM

## 2022-04-15 DIAGNOSIS — I4510 Unspecified right bundle-branch block: Secondary | ICD-10-CM

## 2022-04-15 DIAGNOSIS — I4819 Other persistent atrial fibrillation: Secondary | ICD-10-CM

## 2022-04-15 NOTE — Patient Instructions (Signed)
Medication Instructions:  No changes *If you need a refill on your cardiac medications before your next appointment, please call your pharmacy*   Lab Work: None ordered If you have labs (blood work) drawn today and your tests are completely normal, you will receive your results only by: MyChart Message (if you have MyChart) OR A paper copy in the mail If you have any lab test that is abnormal or we need to change your treatment, we will call you to review the results.   Testing/Procedures: None ordered   Follow-Up: At [facility], you and your health needs are our priority.  As part of our continuing mission to provide you with exceptional heart care, we have created designated Provider Care Teams.  These Care Teams include your primary Cardiologist (physician) and Advanced Practice Providers (APPs -  Physician Assistants and Nurse Practitioners) who all work together to provide you with the care you need, when you need it.  We recommend signing up for the patient portal called "MyChart".  Sign up information is provided on this After Visit Summary.  MyChart is used to connect with patients for Virtual Visits (Telemedicine).  Patients are able to view lab/test results, encounter notes, upcoming appointments, etc.  Non-urgent messages can be sent to your provider as well.   To learn more about what you can do with MyChart, go to [email or web address].    Your next appointment:   12 month(s)  The format for your next appointment:   In Person  Provider:   James Hochrein, MD      Important Information About Sugar       

## 2022-07-06 NOTE — Patient Instructions (Addendum)
DUE TO COVID-19 ONLY TWO VISITORS  (aged 78 and older)  ARE ALLOWED TO COME WITH YOU AND STAY IN THE WAITING ROOM ONLY DURING PRE OP AND PROCEDURE.   **NO VISITORS ARE ALLOWED IN THE SHORT STAY AREA OR RECOVERY ROOM!!**  IF YOU WILL BE ADMITTED INTO THE HOSPITAL YOU ARE ALLOWED ONLY FOUR SUPPORT PEOPLE DURING VISITATION HOURS ONLY (7 AM -8PM)   The support person(s) must pass our screening, gel in and out, and wear a mask at all times, including in the patient's room. Patients must also wear a mask when staff or their support person are in the room. Visitors GUEST BADGE MUST BE WORN VISIBLY  One adult visitor may remain with you overnight and MUST be in the room by 8 P.M.     Your procedure is scheduled on: 07/19/22   Report to [facility] Main Entrance    Report to admitting at 6:10 AM   Call this number if you have problems the morning of surgery [phone]   Do not eat food :After Midnight.   After Midnight you may have the following liquids until __5:30 ____ AM/  DAY OF SURGERY  Water Black Coffee (sugar ok, NO MILK/CREAM OR CREAMERS)  Tea (sugar ok, NO MILK/CREAM OR CREAMERS) regular and decaf                             Plain Jell-O (NO RED)                                           Fruit ices (not with fruit pulp, NO RED)                                     Popsicles (NO RED)                                                                  Juice: apple, WHITE grape, WHITE cranberry Sports drinks like Gatorade (NO RED)                    The day of surgery:  Drink ONE (1) Pre-Surgery Clear Ensure  at 5:15 AM the morning of surgery. Drink in one sitting. Do not sip.  This drink was given to you during your hospital  pre-op appointment visit. Nothing else to drink after completing the  Pre-Surgery Clear Ensure at 5:30 AM          If you have questions, please contact your surgeon's office.       Oral Hygiene is also important to reduce your risk of  infection.                                    Remember - BRUSH YOUR TEETH THE MORNING OF SURGERY WITH YOUR REGULAR TOOTHPASTE  DENTURES WILL BE REMOVED PRIOR TO SURGERY PLEASE DO NOT APPLY "Poly grip" OR ADHESIVES!!!   Do NOT smoke after Midnight  Take these medicines the morning of surgery with A SIP OF WATER: Gabapentin,   Before surgery.Stop taking _Eliquis__________on Serita Grit hours prior to day of surgery,2/3/24_________as instructed by _____________.  Contact your Surgeon/Cardiologist for instructions on Anticoagulant Therapy prior to surgery.   Bring CPAP mask and tubing day of surgery.                              You may not have any metal on your body including , jewelry, and body piercing             Do not wear lotions, powders, perfumes/cologne, or deodorant                Men may shave face and neck.   Do not bring valuables to the hospital. [place].   Contacts, glasses, or bridgework may not be worn into surgery.   Bring small overnight bag day of surgery.   DO NOT [place]. PHARMACY WILL DISPENSE MEDICATIONS LISTED ON YOUR MEDICATION LIST TO YOU DURING YOUR ADMISSION [place]!       Special Instructions: Bring a copy of your healthcare power of attorney and living will documents the day of surgery if you haven't scanned them before.              Please read over the following fact sheets you were given: IF YOU HAVE QUESTIONS ABOUT YOUR PRE-OP INSTRUCTIONS PLEASE CALL [phone]    [facility] - Preparing for Surgery Before surgery, you can play an important role.  Because skin is not sterile, your skin needs to be as free of germs as possible.  You can reduce the number of germs on your skin by washing with CHG (chlorahexidine gluconate) soap before surgery.  CHG is an antiseptic cleaner which kills germs and bonds with the skin to continue killing germs even after  washing. Please DO NOT use if you have an allergy to CHG or antibacterial soaps.  If your skin becomes reddened/irritated stop using the CHG and inform your nurse when you arrive at Short Stay. You may shave your face/neck. Please follow these instructions carefully:  1.  Shower with CHG Soap the night before surgery and the  morning of Surgery.  2.  If you choose to wash your hair, wash your hair first as usual with your  normal  shampoo.  3.  After you shampoo, rinse your hair and body thoroughly to remove the  shampoo.                            4.  Use CHG as you would any other liquid soap.  You can apply chg directly  to the skin and wash                       Gently with a scrungie or clean washcloth.  5.  Apply the CHG Soap to your body ONLY FROM THE NECK DOWN.   Do not use on face/ open                           Wound or open sores. Avoid contact with eyes, ears mouth and genitals (private parts).  Wash face,  Genitals (private parts) with your normal soap.             6.  Wash thoroughly, paying special attention to the area where your surgery  will be performed.  7.  Thoroughly rinse your body with warm water from the neck down.  8.  DO NOT shower/wash with your normal soap after using and rinsing off  the CHG Soap.                9.  Pat yourself dry with a clean towel.            10.  Wear clean pajamas.            11.  Place clean sheets on your bed the night of your first shower and do not  sleep with pets. Day of Surgery : Do not apply any lotions/deodorants the morning of surgery.  Please wear clean clothes to the hospital/surgery center.  FAILURE TO FOLLOW THESE INSTRUCTIONS MAY RESULT IN THE CANCELLATION OF YOUR SURGERY    ________________________________________________________________________  Incentive Spirometer  An incentive spirometer is a tool that can help keep your lungs clear and active. This tool measures how well you are filling your  lungs with each breath. Taking long deep breaths may help reverse or decrease the chance of developing breathing (pulmonary) problems (especially infection) following: A long period of time when you are unable to move or be active. BEFORE THE PROCEDURE  If the spirometer includes an indicator to show your best effort, your nurse or respiratory therapist will set it to a desired goal. If possible, sit up straight or lean slightly forward. Try not to slouch. Hold the incentive spirometer in an upright position. INSTRUCTIONS FOR USE  Sit on the edge of your bed if possible, or sit up as far as you can in bed or on a chair. Hold the incentive spirometer in an upright position. Breathe out normally. Place the mouthpiece in your mouth and seal your lips tightly around it. Breathe in slowly and as deeply as possible, raising the piston or the ball toward the top of the column. Hold your breath for 3-5 seconds or for as long as possible. Allow the piston or ball to fall to the bottom of the column. Remove the mouthpiece from your mouth and breathe out normally. Rest for a few seconds and repeat Steps 1 through 7 at least 10 times every 1-2 hours when you are awake. Take your time and take a few normal breaths between deep breaths. The spirometer may include an indicator to show your best effort. Use the indicator as a goal to work toward during each repetition. After each set of 10 deep breaths, practice coughing to be sure your lungs are clear. If you have an incision (the cut made at the time of surgery), support your incision when coughing by placing a pillow or rolled up towels firmly against it. Once you are able to get out of bed, walk around indoors and cough well. You may stop using the incentive spirometer when instructed by your caregiver.  RISKS AND COMPLICATIONS Take your time so you do not get dizzy or light-headed. If you are in pain, you may need to take or ask for pain medication before  doing incentive spirometry. It is harder to take a deep breath if you are having pain. AFTER USE Rest and breathe slowly and easily. It can be helpful to keep track of a log of  your progress. Your caregiver can provide you with a simple table to help with this. If you are using the spirometer at home, follow these instructions: [place] IF:  You are having difficultly using the spirometer. You have trouble using the spirometer as often as instructed. Your pain medication is not giving enough relief while using the spirometer. You develop fever of 100.5 F (38.1 C) or higher. SEEK IMMEDIATE MEDICAL CARE IF:  You cough up bloody sputum that had not been present before. You develop fever of 102 F (38.9 C) or greater. You develop worsening pain at or near the incision site. MAKE SURE YOU:  Understand these instructions. Will watch your condition. Will get help right away if you are not doing well or get worse. Document Released: 10/10/2006 Document Revised: 08/22/2011 Document Reviewed: 12/11/2006 [facility] Patient Information 2014 [place], [place].   ________________________________________________________________________

## 2022-07-07 ENCOUNTER — Other Ambulatory Visit: Payer: Self-pay

## 2022-07-07 ENCOUNTER — Encounter (HOSPITAL_COMMUNITY): Payer: Self-pay

## 2022-07-07 ENCOUNTER — Encounter (HOSPITAL_COMMUNITY)
Admission: RE | Admit: 2022-07-07 | Discharge: 2022-07-07 | Disposition: A | Discharge status: Home / Self Care | Payer: Medicare Other | Source: Ambulatory Visit | Attending: Orthopedic Surgery | Admitting: Orthopedic Surgery

## 2022-07-07 DIAGNOSIS — I4819 Other persistent atrial fibrillation: Secondary | ICD-10-CM | POA: Diagnosis not present

## 2022-07-07 DIAGNOSIS — Z01812 Encounter for preprocedural laboratory examination: Secondary | ICD-10-CM | POA: Insufficient documentation

## 2022-07-07 DIAGNOSIS — M1611 Unilateral primary osteoarthritis, right hip: Secondary | ICD-10-CM | POA: Diagnosis not present

## 2022-07-07 DIAGNOSIS — Z01818 Encounter for other preprocedural examination: Secondary | ICD-10-CM

## 2022-07-07 HISTORY — DX: Gastro-esophageal reflux disease without esophagitis: K21.9

## 2022-07-07 LAB — BASIC METABOLIC PANEL
Anion gap: 6 (ref 5–15)
BUN: 20 mg/dL (ref 8–23)
CO2: 26 mmol/L (ref 22–32)
Calcium: 8.6 mg/dL — ABNORMAL LOW (ref 8.9–10.3)
Chloride: 104 mmol/L (ref 98–111)
Creatinine, Ser: 0.91 mg/dL (ref 0.61–1.24)
GFR, Estimated: >60 mL/min (ref >60)
Glucose, Bld: 94 mg/dL (ref 70–99)
Potassium: 4.2 mmol/L (ref 3.5–5.1)
Sodium: 136 mmol/L (ref 135–145)

## 2022-07-07 LAB — CBC
HCT: 44.6 % (ref 39.0–52.0)
Hemoglobin: 15.4 g/dL (ref 13.0–17.0)
MCH: 34.5 pg — ABNORMAL HIGH (ref 26.0–34.0)
MCHC: 34.5 g/dL (ref 30.0–36.0)
MCV: 100 fL (ref 80.0–100.0)
Platelets: 180 10*3/uL (ref 150–400)
RBC: 4.46 MIL/uL (ref 4.22–5.81)
RDW: 13 % (ref 11.5–15.5)
WBC: 4.7 10*3/uL (ref 4.0–10.5)
nRBC: 0 % (ref 0.0–0.2)

## 2022-07-07 LAB — TYPE AND SCREEN
ABO/RH(D): O POS
Antibody Screen: NEGATIVE

## 2022-07-07 LAB — SURGICAL PCR SCREEN
MRSA, PCR: NEGATIVE
Staphylococcus aureus: POSITIVE — AB

## 2022-07-07 NOTE — Progress Notes (Signed)
Anesthesia note:  Bowel prep reminder:  NA  PCP - Dr. Adalberto Ill- clearance on chart Cardiologist -Dr. Vita Barley Other-   Chest x-ray - no EKG - 04/15/22-epic Stress Test - 08/30/18-epic ECHO - 08/30/18-epic Cardiac Cath - no CABG-no Pacemaker/ICD device last checked:NA  Sleep Study - no CPAP -   Pt is pre diabetic-no CBG at PAT visit- Fasting Blood Sugar at home- Checks Blood Sugar _____  Blood Thinner:Eliquis Blood Thinner Instructions:Pt will call Dr. Percival Spanish today Aspirin Instructions: Last Dose:07/15/22  Anesthesia review: Yes   reason:A fib  Patient denies shortness of breath, fever, cough and chest pain at PAT appointment. 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    Patient verbalized understanding of instructions that were given to them at the PAT appointment. Patient was also instructed that they will need to review over the PAT instructions again at home before surgery.yes

## 2022-07-14 ENCOUNTER — Encounter: Payer: Self-pay | Admitting: Cardiology

## 2022-07-14 NOTE — Progress Notes (Addendum)
Anesthesia Chart Review   Case: [id number] Date/Time: 07/19/22 0825   Procedure: TOTAL HIP ARTHROPLASTY ANTERIOR APPROACH (Right: Hip)   Anesthesia type: Spinal   Pre-op diagnosis: Right hip osteoarthritis   Location: [facility] ROOM 10 / [facility]   Surgeons: Paralee Cancel, MD       DISCUSSION:77 y.o. never smoker with h/o a-fib, RBBB, right hip pain scheduled for above procedure 07/19/2022 with Dr. Paralee Cancel.   Pt last seen by cardiology 04/15/2022. Per OV note, "PREOP: The patient has a high functional level.  He is not going for high risk procedure.  He has no unstable symptoms.  No change in therapy.   ANTICOAGULATION:    He is OK to hold the Eliqis for 3 days prior to surgery."  Pt reports last dose of Eliquis evening of 07/15/22.   Anticipate pt can proceed with planned procedure barring acute status change.   VS: BP (!) 125/90   Pulse 89   Temp 36.8 C (Oral)   Resp 18   Ht 5\' 10"  (1.778 m)   Wt 78.9 kg   SpO2 100%   BMI 24.97 kg/m   PROVIDERS: Heywood Bene, PA-C is PCP   Minus Breeding, MD is Cardiologist  LABS: Labs reviewed: Acceptable for surgery. (all labs ordered are listed, but only abnormal results are displayed)  Labs Reviewed  SURGICAL PCR SCREEN - Abnormal; Notable for the following components:      Result Value   Staphylococcus aureus POSITIVE (*)    All other components within normal limits  BASIC METABOLIC PANEL - Abnormal; Notable for the following components:   Calcium 8.6 (*)    All other components within normal limits  CBC - Abnormal; Notable for the following components:   MCH 34.5 (*)    All other components within normal limits  TYPE AND SCREEN     IMAGES:   EKG:   CV: Myocardial Perfusion 08/30/2018 Nuclear stress EF: 61%. The left ventricular ejection fraction is normal (55-65%). The study is normal. This is a low risk study.   Normal pharmacologic nuclear stress test with no evidence for a prior infarct or ischemia. Normal  LVEF.   Echo 08/30/2018  1. The left ventricle has normal systolic function, with an ejection  fraction of 55-60%. The cavity size was normal. Left ventricular diastolic  function could not be evaluated secondary to atrial fibrillation. No  evidence of left ventricular regional  wall motion abnormalities.   2. The right ventricle has normal systolic function. The cavity was  normal. There is no increase in right ventricular wall thickness.   3. Left atrial size was mildly dilated.   4. Right atrial size was mildly dilated.   5. Tricuspid valve regurgitation is mild-moderate.   6. The aortic valve is tricuspid Mild calcification of the aortic valve.   7. The aortic root and ascending aorta are normal in size and structure.  Past Medical History:  Diagnosis Date   Dysrhythmia 2018   A-Fib   GERD (gastroesophageal reflux disease)    no meds   Low back pain     Past Surgical History:  Procedure Laterality Date   LUMBAR LAMINECTOMY  1991   ROTATOR CUFF REPAIR Right 2016   ROTATOR CUFF REPAIR Left 2021   TONSILLECTOMY     as a child   VASECTOMY  1981    MEDICATIONS:  apixaban (ELIQUIS) 5 MG TABS tablet   Ascorbic Acid (VITAMIN C PO)   Calcium Citrate (CAL-CITRATE PO)  chlorpheniramine (CHLOR-TRIMETON) 4 MG tablet   Cholecalciferol (VITAMIN D3) 1000 units CAPS   ferrous sulfate 325 (65 FE) MG tablet   gabapentin (NEURONTIN) 300 MG capsule   Magnesium 250 MG TABS   Multiple Vitamin (MULTIVITAMIN) tablet   Omega-3 Fatty Acids (FISH OIL) 1000 MG CPDR   research study medication   No current facility-administered medications for this encounter.   Konrad Felix Ward, PA-C [facility] Pre-Surgical Testing [phone]

## 2022-07-15 NOTE — Anesthesia Preprocedure Evaluation (Signed)
Anesthesia Evaluation  Patient identified by MRN, date of birth, ID band Patient awake    Reviewed: Allergy & Precautions, H&P , NPO status , Patient's Chart, lab work & pertinent test results  Airway Mallampati: II   Neck ROM: full    Dental   Pulmonary neg pulmonary ROS   breath sounds clear to auscultation       Cardiovascular + dysrhythmias Atrial Fibrillation  Rhythm:irregular Rate:Normal  Last dose of Eliquis was 07/15/22.   Neuro/Psych    GI/Hepatic ,GERD  ,,  Endo/Other    Renal/GU      Musculoskeletal  (+) Arthritis ,    Abdominal   Peds  Hematology   Anesthesia Other Findings   Reproductive/Obstetrics                             Anesthesia Physical Anesthesia Plan  ASA: 3  Anesthesia Plan: MAC and Spinal   Post-op Pain Management:    Induction: Intravenous  PONV Risk Score and Plan: 1 and Propofol infusion and Treatment may vary due to age or medical condition  Airway Management Planned: Simple Face Mask  Additional Equipment:   Intra-op Plan:   Post-operative Plan:   Informed Consent: I have reviewed the patients History and Physical, chart, labs and discussed the procedure including the risks, benefits and alternatives for the proposed anesthesia with the patient or authorized representative who has indicated his/her understanding and acceptance.     Dental advisory given  Plan Discussed with: CRNA, Anesthesiologist and Surgeon  Anesthesia Plan Comments: (See PAT note 07/07/2022)       Anesthesia Quick Evaluation

## 2022-07-18 NOTE — H&P (Signed)
TOTAL HIP ADMISSION H&P  Patient is admitted for right total hip arthroplasty.  Subjective:  Chief Complaint: right hip pain  HPI: Andre Parker, 77 y.o. male, has a history of pain and functional disability in the right hip(s) due to arthritis and patient has failed non-surgical conservative treatments for greater than 12 weeks to include NSAID's and/or analgesics and activity modification.  Onset of symptoms was gradual starting 2 years ago with gradually worsening course since that time.The patient noted no past surgery on the right hip(s).  Patient currently rates pain in the right hip at 8 out of 10 with activity. Patient has worsening of pain with activity and weight bearing, pain that interfers with activities of daily living, and pain with passive range of motion. Patient has evidence of joint space narrowing by imaging studies. This condition presents safety issues increasing the risk of falls.  There is no current active infection.  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   Current Outpatient Medications  Medication Sig Dispense Refill Last Dose   apixaban (ELIQUIS) 5 MG TABS tablet Take 1 tablet (5 mg total) by mouth 2 (two) times daily. 60 tablet 5    Ascorbic Acid (VITAMIN C PO) Take 1 tablet by mouth daily.      Calcium Citrate (CAL-CITRATE PO) Take 1 tablet by mouth in the morning and at bedtime.      chlorpheniramine (CHLOR-TRIMETON) 4 MG tablet Take 4 mg by mouth every 6 (six) hours as needed for allergies.      Cholecalciferol (VITAMIN D3) 1000 units CAPS Take 1,000 Units by mouth at bedtime.      ferrous sulfate 325 (65 FE) MG tablet Take 325 mg by mouth at bedtime.      gabapentin (NEURONTIN) 300 MG capsule Take 300 mg by mouth 3 (three) times daily.      Magnesium 250 MG TABS Take 250 mg by mouth in the morning and at bedtime.      Multiple Vitamin (MULTIVITAMIN) tablet Take 1 tablet by mouth daily.      Omega-3 Fatty Acids (FISH OIL) 1000 MG CPDR Take 1,000 mg by mouth daily.      research study medication Atorvastatin 40mg  or placebo daily. Study at [facility].      No Known Allergies  Social History   Tobacco Use   Smoking status: Never   Smokeless tobacco: Never  Substance Use Topics   Alcohol use: Never    Family History  Problem Relation Age of Onset   Heart disease Father        Used NTG   Prostate cancer Father    Myelodysplastic syndrome Sister    Prostate cancer Brother      Review of Systems  Constitutional:  Negative for chills and fever.  Respiratory:  Negative for cough and shortness of breath.   Cardiovascular:  Negative for chest pain.  Gastrointestinal:  Negative for nausea and vomiting.  Musculoskeletal:  Positive for arthralgias.     Objective:  Physical Exam Well nourished and well developed. General: Alert and oriented x3,  cooperative and pleasant, no acute distress. Head: normocephalic, atraumatic, neck supple. Eyes: EOMI.  Musculoskeletal: Right hip exam: Painful and limited hip flexion internal rotation over 5 degrees, external rotation over 20 degrees Active hip flexion with full strength and slight external rotation contracture but painful nonetheless with resistance Whereas his left hip at this point is pain-free with passive and active range of motion assessment  Calves soft and nontender. Motor function intact in LE. Strength 5/5 LE bilaterally. Neuro: Distal pulses 2+. Sensation to light touch intact in LE.  Vital signs in last 24 hours:    Labs:   Estimated body mass index is 24.97 kg/m as calculated from the following:   Height as of 07/07/22: 5\' 10"  (1.778 m).   Weight as of 07/07/22: 78.9 kg.   Imaging Review Plain radiographs demonstrate severe degenerative joint disease of the right hip(s). The bone quality appears to be adequate for age and reported activity level.      Assessment/Plan:  End stage arthritis, right hip(s)  The patient history, physical examination, clinical judgement of the provider and imaging studies are consistent with end stage degenerative joint disease of the right hip(s) and total hip arthroplasty is deemed medically necessary. The treatment options including medical management, injection therapy, arthroscopy and arthroplasty were discussed at length. The risks and benefits of total hip arthroplasty were presented and reviewed. The risks due to aseptic loosening, infection, stiffness, dislocation/subluxation,  thromboembolic complications and other imponderables were discussed.  The patient acknowledged the explanation, agreed to proceed with the plan and consent was signed. Patient is being admitted for inpatient treatment for surgery, pain control, PT, OT, prophylactic antibiotics, VTE prophylaxis, progressive ambulation and ADL's and discharge planning.The  patient is planning to be discharged  home.  Therapy Plans: HEP Disposition: Home with wife Planned DVT Prophylaxis: aspirin 325mg  BID DME needed: none PCP: Clyde Lundborg, clearance received Cardio: Dr. Percival Spanish, clearance received TXA: IV Allergies: NKDA Anesthesia Concerns: none BMI: 25 Last HgbA1c: Not diabetic   Other: - robaxin, tylenol, (has pain meds at home) - will try hydrocodone - Patient is a retired Chief Financial Officer   Costella Hatcher, PA-C Orthopedic Surgery [facility] [place] [phone]

## 2022-07-19 ENCOUNTER — Ambulatory Visit (HOSPITAL_COMMUNITY): Payer: Medicare Other

## 2022-07-19 ENCOUNTER — Observation Stay (HOSPITAL_COMMUNITY)
Admission: RE | Admit: 2022-07-19 | Discharge: 2022-07-20 | Disposition: A | Discharge status: Home / Self Care | Payer: Medicare Other | Attending: Orthopedic Surgery | Admitting: Orthopedic Surgery

## 2022-07-19 ENCOUNTER — Observation Stay (HOSPITAL_COMMUNITY): Payer: Medicare Other

## 2022-07-19 ENCOUNTER — Other Ambulatory Visit: Payer: Self-pay

## 2022-07-19 ENCOUNTER — Ambulatory Visit (HOSPITAL_BASED_OUTPATIENT_CLINIC_OR_DEPARTMENT_OTHER): Payer: Medicare Other | Admitting: Certified Registered Nurse Anesthetist

## 2022-07-19 ENCOUNTER — Encounter (HOSPITAL_COMMUNITY)
Admission: RE | Disposition: A | Discharge status: Home / Self Care | Payer: Self-pay | Source: Home / Self Care | Attending: Orthopedic Surgery

## 2022-07-19 ENCOUNTER — Ambulatory Visit (HOSPITAL_COMMUNITY): Payer: Medicare Other | Admitting: Physician Assistant

## 2022-07-19 ENCOUNTER — Encounter (HOSPITAL_COMMUNITY): Payer: Self-pay | Admitting: Orthopedic Surgery

## 2022-07-19 DIAGNOSIS — I4821 Permanent atrial fibrillation: Secondary | ICD-10-CM | POA: Insufficient documentation

## 2022-07-19 DIAGNOSIS — M1611 Unilateral primary osteoarthritis, right hip: Secondary | ICD-10-CM

## 2022-07-19 DIAGNOSIS — Z7901 Long term (current) use of anticoagulants: Secondary | ICD-10-CM | POA: Diagnosis not present

## 2022-07-19 DIAGNOSIS — I482 Chronic atrial fibrillation, unspecified: Secondary | ICD-10-CM | POA: Insufficient documentation

## 2022-07-19 DIAGNOSIS — Z96641 Presence of right artificial hip joint: Secondary | ICD-10-CM

## 2022-07-19 DIAGNOSIS — Z01818 Encounter for other preprocedural examination: Secondary | ICD-10-CM

## 2022-07-19 DIAGNOSIS — I4891 Unspecified atrial fibrillation: Secondary | ICD-10-CM | POA: Diagnosis not present

## 2022-07-19 DIAGNOSIS — I4819 Other persistent atrial fibrillation: Secondary | ICD-10-CM

## 2022-07-19 DIAGNOSIS — Z79899 Other long term (current) drug therapy: Secondary | ICD-10-CM | POA: Diagnosis not present

## 2022-07-19 HISTORY — PX: TOTAL HIP ARTHROPLASTY: SHX124

## 2022-07-19 LAB — ABO/RH: ABO/RH(D): O POS

## 2022-07-19 SURGERY — ARTHROPLASTY, HIP, TOTAL, ANTERIOR APPROACH
Anesthesia: Monitor Anesthesia Care | Site: Hip | Laterality: Right

## 2022-07-19 MED ORDER — MORPHINE SULFATE (PF) 2 MG/ML IV SOLN: 0.5000 mg | INTRAVENOUS | Status: DC | PRN

## 2022-07-19 MED ORDER — SODIUM CHLORIDE 0.9 % IV SOLN: INTRAVENOUS | Status: DC

## 2022-07-19 MED ORDER — METOCLOPRAMIDE HCL 5 MG/ML IJ SOLN: 5.0000 mg | Freq: Three times a day (TID) | INTRAMUSCULAR | Status: DC | PRN

## 2022-07-19 MED ORDER — TRANEXAMIC ACID-NACL 1000-0.7 MG/100ML-% IV SOLN
1000.0000 mg | Freq: Once | INTRAVENOUS | Status: AC
  Administered 2022-07-19: 1000 mg via INTRAVENOUS
  Filled 2022-07-19: qty 100

## 2022-07-19 MED ORDER — CEFAZOLIN SODIUM-DEXTROSE 2-4 GM/100ML-% IV SOLN
2.0000 g | Freq: Four times a day (QID) | INTRAVENOUS | Status: AC
  Administered 2022-07-19 (×2): 2 g via INTRAVENOUS
  Filled 2022-07-19 (×2): qty 100

## 2022-07-19 MED ORDER — EPHEDRINE 5 MG/ML INJ
INTRAVENOUS | Status: AC
  Filled 2022-07-19: qty 5

## 2022-07-19 MED ORDER — HYDROCODONE-ACETAMINOPHEN 7.5-325 MG PO TABS: 1.0000 | ORAL_TABLET | ORAL | Status: DC | PRN

## 2022-07-19 MED ORDER — DEXAMETHASONE SODIUM PHOSPHATE 10 MG/ML IJ SOLN: 8.0000 mg | Freq: Once | INTRAMUSCULAR | Status: DC

## 2022-07-19 MED ORDER — DEXAMETHASONE SODIUM PHOSPHATE 10 MG/ML IJ SOLN
INTRAMUSCULAR | Status: DC | PRN
  Administered 2022-07-19: 5 mg via INTRAVENOUS

## 2022-07-19 MED ORDER — ONDANSETRON HCL 4 MG PO TABS: 4.0000 mg | ORAL_TABLET | Freq: Four times a day (QID) | ORAL | Status: DC | PRN

## 2022-07-19 MED ORDER — LACTATED RINGERS IV SOLN: INTRAVENOUS | Status: DC

## 2022-07-19 MED ORDER — POLYETHYLENE GLYCOL 3350 17 G PO PACK
17.0000 g | PACK | Freq: Two times a day (BID) | ORAL | Status: DC
  Administered 2022-07-19 – 2022-07-20 (×3): 17 g via ORAL
  Filled 2022-07-19 (×3): qty 1

## 2022-07-19 MED ORDER — ONDANSETRON HCL 4 MG/2ML IJ SOLN
INTRAMUSCULAR | Status: AC
  Filled 2022-07-19: qty 6

## 2022-07-19 MED ORDER — LIDOCAINE 2% (20 MG/ML) 5 ML SYRINGE
INTRAMUSCULAR | Status: DC | PRN
  Administered 2022-07-19: 60 mg via INTRAVENOUS

## 2022-07-19 MED ORDER — OXYCODONE HCL 5 MG PO TABS: 5.0000 mg | ORAL_TABLET | Freq: Once | ORAL | Status: DC | PRN

## 2022-07-19 MED ORDER — PHENYLEPHRINE 80 MCG/ML (10ML) SYRINGE FOR IV PUSH (FOR BLOOD PRESSURE SUPPORT)
PREFILLED_SYRINGE | INTRAVENOUS | Status: AC
  Filled 2022-07-19: qty 10

## 2022-07-19 MED ORDER — SODIUM CHLORIDE (PF) 0.9 % IJ SOLN
INTRAMUSCULAR | Status: AC
  Filled 2022-07-19: qty 30

## 2022-07-19 MED ORDER — SODIUM CHLORIDE (PF) 0.9 % IJ SOLN
INTRAMUSCULAR | Status: DC | PRN
  Administered 2022-07-19 (×3): 10 mL

## 2022-07-19 MED ORDER — STERILE WATER FOR IRRIGATION IR SOLN
Status: DC | PRN
  Administered 2022-07-19 (×2): 1000 mL

## 2022-07-19 MED ORDER — CHLORHEXIDINE GLUCONATE 0.12 % MT SOLN
15.0000 mL | Freq: Once | OROMUCOSAL | Status: AC
  Administered 2022-07-19: 15 mL via OROMUCOSAL

## 2022-07-19 MED ORDER — HYDROCODONE-ACETAMINOPHEN 5-325 MG PO TABS
1.0000 | ORAL_TABLET | ORAL | Status: DC | PRN
  Administered 2022-07-19 (×2): 1 via ORAL
  Filled 2022-07-19 (×2): qty 1

## 2022-07-19 MED ORDER — METHOCARBAMOL 500 MG IVPB - SIMPLE MED: 500.0000 mg | Freq: Four times a day (QID) | INTRAVENOUS | Status: DC | PRN

## 2022-07-19 MED ORDER — 0.9 % SODIUM CHLORIDE (POUR BTL) OPTIME
TOPICAL | Status: DC | PRN
  Administered 2022-07-19: 1000 mL

## 2022-07-19 MED ORDER — ONDANSETRON HCL 4 MG/2ML IJ SOLN: 4.0000 mg | Freq: Four times a day (QID) | INTRAMUSCULAR | Status: DC | PRN

## 2022-07-19 MED ORDER — PROPOFOL 1000 MG/100ML IV EMUL
INTRAVENOUS | Status: AC
  Filled 2022-07-19: qty 300

## 2022-07-19 MED ORDER — FENTANYL CITRATE (PF) 100 MCG/2ML IJ SOLN
INTRAMUSCULAR | Status: AC
  Filled 2022-07-19: qty 2

## 2022-07-19 MED ORDER — KETOROLAC TROMETHAMINE 30 MG/ML IJ SOLN
INTRAMUSCULAR | Status: AC
  Filled 2022-07-19: qty 1

## 2022-07-19 MED ORDER — DIPHENHYDRAMINE HCL 12.5 MG/5ML PO ELIX: 12.5000 mg | ORAL_SOLUTION | ORAL | Status: DC | PRN

## 2022-07-19 MED ORDER — BISACODYL 10 MG RE SUPP: 10.0000 mg | Freq: Every day | RECTAL | Status: DC | PRN

## 2022-07-19 MED ORDER — TRANEXAMIC ACID-NACL 1000-0.7 MG/100ML-% IV SOLN
1000.0000 mg | INTRAVENOUS | Status: AC
  Administered 2022-07-19: 1000 mg via INTRAVENOUS
  Filled 2022-07-19: qty 100

## 2022-07-19 MED ORDER — ORAL CARE MOUTH RINSE: 15.0000 mL | Freq: Once | OROMUCOSAL | Status: AC

## 2022-07-19 MED ORDER — PHENOL 1.4 % MT LIQD: 1.0000 | OROMUCOSAL | Status: DC | PRN

## 2022-07-19 MED ORDER — DEXAMETHASONE SODIUM PHOSPHATE 10 MG/ML IJ SOLN
10.0000 mg | Freq: Once | INTRAMUSCULAR | Status: AC
  Administered 2022-07-20: 10 mg via INTRAVENOUS
  Filled 2022-07-19: qty 1

## 2022-07-19 MED ORDER — METOCLOPRAMIDE HCL 5 MG PO TABS: 5.0000 mg | ORAL_TABLET | Freq: Three times a day (TID) | ORAL | Status: DC | PRN

## 2022-07-19 MED ORDER — FERROUS SULFATE 325 (65 FE) MG PO TABS
325.0000 mg | ORAL_TABLET | Freq: Every day | ORAL | Status: DC
  Administered 2022-07-19: 325 mg via ORAL
  Filled 2022-07-19: qty 1

## 2022-07-19 MED ORDER — DEXMEDETOMIDINE HCL IN NACL 80 MCG/20ML IV SOLN
INTRAVENOUS | Status: AC
  Filled 2022-07-19: qty 20

## 2022-07-19 MED ORDER — DEXAMETHASONE SODIUM PHOSPHATE 10 MG/ML IJ SOLN
INTRAMUSCULAR | Status: AC
  Filled 2022-07-19: qty 4

## 2022-07-19 MED ORDER — EPINEPHRINE PF 1 MG/ML IJ SOLN
INTRAMUSCULAR | Status: DC | PRN
  Administered 2022-07-19: .15 mL

## 2022-07-19 MED ORDER — LIDOCAINE HCL (PF) 2 % IJ SOLN
INTRAMUSCULAR | Status: AC
  Filled 2022-07-19: qty 15

## 2022-07-19 MED ORDER — DOCUSATE SODIUM 100 MG PO CAPS
100.0000 mg | ORAL_CAPSULE | Freq: Two times a day (BID) | ORAL | Status: DC
  Administered 2022-07-19 – 2022-07-20 (×2): 100 mg via ORAL
  Filled 2022-07-19 (×2): qty 1

## 2022-07-19 MED ORDER — APIXABAN 5 MG PO TABS
5.0000 mg | ORAL_TABLET | Freq: Two times a day (BID) | ORAL | Status: DC
  Administered 2022-07-20: 5 mg via ORAL
  Filled 2022-07-19: qty 1

## 2022-07-19 MED ORDER — DEXMEDETOMIDINE HCL IN NACL 80 MCG/20ML IV SOLN
INTRAVENOUS | Status: DC | PRN
  Administered 2022-07-19: 8 ug via BUCCAL
  Administered 2022-07-19: 4 ug via BUCCAL

## 2022-07-19 MED ORDER — POVIDONE-IODINE 10 % EX SWAB
2.0000 | Freq: Once | CUTANEOUS | Status: AC
  Administered 2022-07-19: 2 via TOPICAL

## 2022-07-19 MED ORDER — PHENYLEPHRINE 80 MCG/ML (10ML) SYRINGE FOR IV PUSH (FOR BLOOD PRESSURE SUPPORT)
PREFILLED_SYRINGE | INTRAVENOUS | Status: AC
  Filled 2022-07-19: qty 20

## 2022-07-19 MED ORDER — ACETAMINOPHEN 325 MG PO TABS: 325.0000 mg | ORAL_TABLET | Freq: Four times a day (QID) | ORAL | Status: DC | PRN

## 2022-07-19 MED ORDER — FENTANYL CITRATE PF 50 MCG/ML IJ SOSY: 25.0000 ug | PREFILLED_SYRINGE | INTRAMUSCULAR | Status: DC | PRN

## 2022-07-19 MED ORDER — OXYCODONE HCL 5 MG/5ML PO SOLN: 5.0000 mg | Freq: Once | ORAL | Status: DC | PRN

## 2022-07-19 MED ORDER — PROPOFOL 1000 MG/100ML IV EMUL
INTRAVENOUS | Status: AC
  Filled 2022-07-19: qty 100

## 2022-07-19 MED ORDER — METHOCARBAMOL 500 MG PO TABS: 500.0000 mg | ORAL_TABLET | Freq: Four times a day (QID) | ORAL | Status: DC | PRN

## 2022-07-19 MED ORDER — GABAPENTIN 300 MG PO CAPS
300.0000 mg | ORAL_CAPSULE | Freq: Three times a day (TID) | ORAL | Status: DC
  Administered 2022-07-19 – 2022-07-20 (×3): 300 mg via ORAL
  Filled 2022-07-19 (×3): qty 1

## 2022-07-19 MED ORDER — BUPIVACAINE HCL 0.25 % IJ SOLN
INTRAMUSCULAR | Status: AC
  Filled 2022-07-19: qty 1

## 2022-07-19 MED ORDER — BUPIVACAINE IN DEXTROSE 0.75-8.25 % IT SOLN
INTRATHECAL | Status: DC | PRN
  Administered 2022-07-19: 1.8 mL via INTRATHECAL

## 2022-07-19 MED ORDER — EPINEPHRINE PF 1 MG/ML IJ SOLN
INTRAMUSCULAR | Status: AC
  Filled 2022-07-19: qty 1

## 2022-07-19 MED ORDER — PROPOFOL 500 MG/50ML IV EMUL
INTRAVENOUS | Status: DC | PRN
  Administered 2022-07-19: 30 ug/kg/min via INTRAVENOUS

## 2022-07-19 MED ORDER — ONDANSETRON HCL 4 MG/2ML IJ SOLN
INTRAMUSCULAR | Status: DC | PRN
  Administered 2022-07-19: 4 mg via INTRAVENOUS

## 2022-07-19 MED ORDER — FENTANYL CITRATE (PF) 100 MCG/2ML IJ SOLN
INTRAMUSCULAR | Status: DC | PRN
  Administered 2022-07-19 (×2): 50 ug via INTRAVENOUS

## 2022-07-19 MED ORDER — PHENYLEPHRINE HCL-NACL 20-0.9 MG/250ML-% IV SOLN
INTRAVENOUS | Status: DC | PRN
  Administered 2022-07-19: 50 ug/min via INTRAVENOUS

## 2022-07-19 MED ORDER — PHENYLEPHRINE 80 MCG/ML (10ML) SYRINGE FOR IV PUSH (FOR BLOOD PRESSURE SUPPORT)
PREFILLED_SYRINGE | INTRAVENOUS | Status: DC | PRN
  Administered 2022-07-19: 160 ug via INTRAVENOUS

## 2022-07-19 MED ORDER — KETOROLAC TROMETHAMINE 30 MG/ML IJ SOLN
INTRAMUSCULAR | Status: DC | PRN
  Administered 2022-07-19: 30 mg

## 2022-07-19 MED ORDER — MENTHOL 3 MG MT LOZG: 1.0000 | LOZENGE | OROMUCOSAL | Status: DC | PRN

## 2022-07-19 MED ORDER — CEFAZOLIN SODIUM-DEXTROSE 2-4 GM/100ML-% IV SOLN
2.0000 g | INTRAVENOUS | Status: AC
  Administered 2022-07-19: 2 g via INTRAVENOUS
  Filled 2022-07-19: qty 100

## 2022-07-19 MED ORDER — BUPIVACAINE HCL (PF) 0.25 % IJ SOLN
INTRAMUSCULAR | Status: DC | PRN
  Administered 2022-07-19: 30 mL

## 2022-07-19 SURGICAL SUPPLY — 42 items
ADH SKN CLS APL DERMABOND .7 (GAUZE/BANDAGES/DRESSINGS) ×1
BAG COUNTER SPONGE SURGICOUNT (BAG) IMPLANT
BAG DECANTER FOR FLEXI CONT (MISCELLANEOUS) IMPLANT
BAG SPEC THK2 15X12 ZIP CLS (MISCELLANEOUS)
BAG SPNG CNTER NS LX DISP (BAG)
BAG ZIPLOCK 12X15 (MISCELLANEOUS) IMPLANT
BLADE SAG 18X100X1.27 (BLADE) ×1 IMPLANT
COVER PERINEAL POST (MISCELLANEOUS) ×1 IMPLANT
COVER SURGICAL LIGHT HANDLE (MISCELLANEOUS) ×1 IMPLANT
CUP ACET PINNACLE SECTR 56MM (Hips) IMPLANT
DERMABOND ADVANCED .7 DNX12 (GAUZE/BANDAGES/DRESSINGS) ×1 IMPLANT
DRAPE FOOT SWITCH (DRAPES) ×1 IMPLANT
DRAPE STERI IOBAN 125X83 (DRAPES) ×1 IMPLANT
DRAPE U-SHAPE 47X51 STRL (DRAPES) ×2 IMPLANT
DRESSING AQUACEL AG SP 3.5X10 (GAUZE/BANDAGES/DRESSINGS) ×1 IMPLANT
DRSG AQUACEL AG SP 3.5X10 (GAUZE/BANDAGES/DRESSINGS) ×1
DURAPREP 26ML APPLICATOR (WOUND CARE) ×1 IMPLANT
ELECT REM PT RETURN 15FT ADLT (MISCELLANEOUS) ×1 IMPLANT
GLOVE BIO SURGEON STRL SZ 6 (GLOVE) ×1 IMPLANT
GLOVE BIOGEL PI IND STRL 6.5 (GLOVE) ×1 IMPLANT
GLOVE BIOGEL PI IND STRL 7.5 (GLOVE) ×1 IMPLANT
GLOVE ORTHO TXT STRL SZ7.5 (GLOVE) ×2 IMPLANT
GOWN STRL REUS W/ TWL LRG LVL3 (GOWN DISPOSABLE) ×2 IMPLANT
GOWN STRL REUS W/TWL LRG LVL3 (GOWN DISPOSABLE) ×2
HEAD M SROM 36MM PLUS 1.5 (Hips) IMPLANT
HOLDER FOLEY CATH W/STRAP (MISCELLANEOUS) ×1 IMPLANT
KIT TURNOVER KIT A (KITS) IMPLANT
PACK ANTERIOR HIP CUSTOM (KITS) ×1 IMPLANT
PINNACLE ALTRX PLUS 4 N 36X56 (Hips) IMPLANT
PINNACLE SECTOR CUP 56MM (Hips) ×1 IMPLANT
SCREW 6.5MMX30MM (Screw) IMPLANT
SROM M HEAD 36MM PLUS 1.5 (Hips) ×1 IMPLANT
STEM FEM ACTIS HIGH SZ8 (Stem) IMPLANT
SUT MNCRL AB 4-0 PS2 18 (SUTURE) ×1 IMPLANT
SUT STRATAFIX 0 PDS 27 VIOLET (SUTURE) ×1
SUT VIC AB 1 CT1 36 (SUTURE) ×3 IMPLANT
SUT VIC AB 2-0 CT1 27 (SUTURE) ×2
SUT VIC AB 2-0 CT1 TAPERPNT 27 (SUTURE) ×2 IMPLANT
SUTURE STRATFX 0 PDS 27 VIOLET (SUTURE) ×1 IMPLANT
TRAY FOLEY MTR SLVR 16FR STAT (SET/KITS/TRAYS/PACK) IMPLANT
TUBE SUCTION HIGH CAP CLEAR NV (SUCTIONS) ×1 IMPLANT
WATER STERILE IRR 1000ML POUR (IV SOLUTION) ×1 IMPLANT

## 2022-07-19 NOTE — Anesthesia Procedure Notes (Signed)
Spinal  Patient location during procedure: OR Start time: 07/19/2022 9:02 AM Reason for block: surgical anesthesia Staffing Performed: resident/CRNA  Anesthesiologist: Albertha Ghee, MD Resident/CRNA: Gerald Leitz, CRNA Performed by: Gerald Leitz, CRNA Authorized by: Albertha Ghee, MD   Preanesthetic Checklist Completed: patient identified, IV checked, site marked, risks and benefits discussed, surgical consent, monitors and equipment checked, pre-op evaluation and timeout performed Spinal Block Patient position: sitting Prep: ChloraPrep Patient monitoring: heart rate, continuous pulse ox, blood pressure and cardiac monitor Approach: midline Location: L3-4 Injection technique: single-shot Needle Needle type: Introducer and Pencan  Needle gauge: 24 G Needle length: 9 cm Assessment Sensory level: T4 Events: CSF return Additional Notes Negative paresthesia. Negative blood return. Positive free-flowing CSF. Expiration date of kit checked and confirmed. Patient tolerated procedure well, without complications.

## 2022-07-19 NOTE — Anesthesia Postprocedure Evaluation (Signed)
Anesthesia Post Note  Patient: Andre Parker  Procedure(s) Performed: TOTAL HIP ARTHROPLASTY ANTERIOR APPROACH (Right: Hip)     Patient location during evaluation: PACU Anesthesia Type: MAC and Spinal Level of consciousness: oriented and awake and alert Pain management: pain level controlled Vital Signs Assessment: post-procedure vital signs reviewed and stable Respiratory status: spontaneous breathing, respiratory function stable and patient connected to nasal cannula oxygen Cardiovascular status: blood pressure returned to baseline and stable Postop Assessment: no headache, no backache and no apparent nausea or vomiting Anesthetic complications: no   No notable events documented.  Last Vitals:  Vitals:   07/19/22 1215 07/19/22 1245  BP: 112/80 106/75  Pulse:  91  Resp: 20 18  Temp: 36.4 C (!) 36.3 C  SpO2: 99% 99%    Last Pain:  Vitals:   07/19/22 1245  TempSrc: Axillary  PainSc: 0-No pain                 Rikki Trosper S

## 2022-07-19 NOTE — Op Note (Signed)
NAME:  Derald Lorge                ACCOUNT NO.: [id number]      MEDICAL RECORD NO.: [id number]      FACILITY:  [facility]      PHYSICIAN:  Mauri Pole  DATE OF BIRTH:  06-Nov-1945     DATE OF PROCEDURE:  07/19/2022                                 OPERATIVE REPORT         PREOPERATIVE DIAGNOSIS: Right  hip osteoarthritis.      POSTOPERATIVE DIAGNOSIS:  Right hip osteoarthritis.      PROCEDURE:  Right total hip replacement through an anterior approach   utilizing DePuy THR system, component size 56 mm pinnacle cup, a size 36+4 neutral   Altrex liner, a size 8 Hi Actis stem with a 36+1.5 Articuleze metal head   ball.      SURGEON:  Pietro Cassis. Alvan Dame, M.D.      ASSISTANT:  Costella Hatcher, PA-C     ANESTHESIA:  Spinal.      SPECIMENS:  None.      COMPLICATIONS:  None.      BLOOD LOSS:  250 cc     DRAINS:  None.      INDICATION OF THE PROCEDURE:  Andre Parker is a 77 y.o. male who had   presented to office for evaluation of right hip pain.  Radiographs revealed   progressive degenerative changes with bone-on-bone   articulation of the  hip joint, including subchondral cystic changes and osteophytes.  The patient had painful limited range of   motion significantly affecting their overall quality of life and function.  The patient was failing to    respond to conservative measures including medications and/or injections and activity modification and at this point was ready   to proceed with more definitive measures.  Consent was obtained for   benefit of pain relief.  Specific risks of infection, DVT, component   failure, dislocation, neurovascular injury, and need for revision surgery were reviewed in the office.     PROCEDURE IN DETAIL:  The patient was brought to operative theater.   Once adequate anesthesia, preoperative antibiotics, 2 gm of Ancef, 1 gm of Tranexamic Acid, and 10 mg of Decadron were administered, the patient was positioned supine on the [employer] table.  Once the patient was safely positioned with adequate padding of boney prominences we predraped out the hip, and used fluoroscopy to confirm orientation of the pelvis.      The right hip was then prepped and draped from proximal iliac crest to   mid thigh with a shower curtain technique.      Time-out was performed identifying the patient, planned procedure, and the appropriate extremity.     An incision was then made 2 cm lateral to the   anterior superior iliac spine extending over the orientation of the   tensor fascia lata muscle and sharp dissection was carried down to the   fascia of the muscle.      The fascia was then incised.  The muscle belly was identified and swept   laterally and retractor placed along the superior neck.  Following   cauterization of the circumflex vessels and removing some pericapsular   fat, a second cobra retractor was placed on the inferior neck.  A T-capsulotomy was made along the line of the   superior neck to the trochanteric fossa, then extended proximally and   distally.  Tag sutures were placed and the retractors were then placed   intracapsular.  We then identified the trochanteric fossa and   orientation of my neck cut and then made a neck osteotomy with the femur on traction.  The femoral   head was removed without difficulty or complication.  Traction was let   off and retractors were placed posterior and anterior around the   acetabulum.      The labrum and foveal tissue were debrided.  I began reaming with a 48 mm   reamer and reamed up to 55 mm reamer with good bony bed preparation and a 56 mm  cup was chosen.  The final 56 mm Pinnacle cup was then impacted under fluoroscopy to confirm the depth of penetration and orientation with respect to   Abduction and forward flexion.  A screw was placed into the ilium followed by the hole eliminator.  The final   36+4 neutral Altrex liner was impacted with good visualized rim fit.  The cup  was positioned anatomically within the acetabular portion of the pelvis.      At this point, the femur was rolled to 100 degrees.  Further capsule was   released off the inferior aspect of the femoral neck.  I then   released the superior capsule proximally.  With the leg in a neutral position the hook was placed laterally   along the femur under the vastus lateralis origin and elevated manually and then held in position using the hook attachment on the bed.  The leg was then extended and adducted with the leg rolled to 100   degrees of external rotation.  Retractors were placed along the medial calcar and posteriorly over the greater trochanter.  Once the proximal femur was fully   exposed, I used a box osteotome to set orientation.  I then began   broaching with the starting chili pepper broach and passed this by hand and then broached up to 8.  With the 8 broach in place I chose a high offset neck and did several trial reductions.  The offset was appropriate, leg lengths   appeared to be equal best matched with the +1.5 head ball trial confirmed radiographically.   Given these findings, I went ahead and dislocated the hip, repositioned all   retractors and positioned the right hip in the extended and abducted position.  The final 8 Hi Actis stem was   chosen and it was impacted down to the level of neck cut.  Based on this   and the trial reductions, a final 36+1.5 Articuleze metal head ball was chosen and   impacted onto a clean and dry trunnion, and the hip was reduced.  The   hip had been irrigated throughout the case again at this point.  I did   reapproximate the superior capsular leaflet to the anterior leaflet   using #1 Vicryl.  The fascia of the   tensor fascia lata muscle was then reapproximated using #1 Vicryl and #0 Stratafix sutures.  The   remaining wound was closed with 2-0 Vicryl and running 4-0 Monocryl.   The hip was cleaned, dried, and dressed sterilely using Dermabond and    Aquacel dressing.  The patient was then brought   to recovery room in stable condition tolerating the procedure well.    Costella Hatcher,  PA-C was present for the entirety of the case involved from   preoperative positioning, perioperative retractor management, general   facilitation of the case, as well as primary wound closure as assistant.            Pietro Cassis Alvan Dame, M.D.        07/19/2022 10:10 AM

## 2022-07-19 NOTE — Transfer of Care (Signed)
Immediate Anesthesia Transfer of Care Note  Patient: Marvens Hollars  Procedure(s) Performed: Procedure(s): TOTAL HIP ARTHROPLASTY ANTERIOR APPROACH (Right)  Patient Location: PACU  Anesthesia Type:General  Level of Consciousness: Alert, Awake, Oriented  Airway & Oxygen Therapy: Patient Spontanous Breathing  Post-op Assessment: Report given to RN  Post vital signs: Reviewed and stable  Last Vitals:  Vitals:   07/19/22 0713  BP: 118/84  Pulse: 88  Resp: 17  Temp: 36.8 C  SpO2: 17%    Complications: No apparent anesthesia complications

## 2022-07-19 NOTE — Interval H&P Note (Signed)
History and Physical Interval Note:  07/19/2022 6:40 AM  Andre Parker  has presented today for surgery, with the diagnosis of Right hip osteoarthritis.  The various methods of treatment have been discussed with the patient and family. After consideration of risks, benefits and other options for treatment, the patient has consented to  Procedure(s): TOTAL HIP ARTHROPLASTY ANTERIOR APPROACH (Right) as a surgical intervention.  The patient's history has been reviewed, patient examined, no change in status, stable for surgery.  I have reviewed the patient's chart and labs.  Questions were answered to the patient's satisfaction.     Mauri Pole

## 2022-07-19 NOTE — Discharge Instructions (Signed)

## 2022-07-19 NOTE — Evaluation (Signed)
Physical Therapy Evaluation Patient Details Name: Andre Parker MRN: [id number] DOB: [birth year] Today's Date: 07/19/2022  History of Present Illness  77 yo male s/p R THA-DA 07/19/22. Hx of Afib, comp fx  Clinical Impression  On eval POD 0, pt was Supv level for mobility. He walked ~150 feet around the unit. No dizziness reported. Pt tolerated distance well. He does have some increased IR of R hip noted at rest and with activity. Will plan to follow and progress activity as tolerated. Plan is for home with HEP.        Recommendations for follow up therapy are one component of a multi-disciplinary discharge planning process, led by the attending physician.  Recommendations may be updated based on patient status, additional functional criteria and insurance authorization.  Follow Up Recommendations Follow physician's recommendations for discharge plan and follow up therapies      Assistance Recommended at Discharge PRN  Patient can return home with the following  Assistance with cooking/housework;Assist for transportation;Help with stairs or ramp for entrance;A little help with bathing/dressing/bathroom    Equipment Recommendations None recommended by PT  Recommendations for Other Services       Functional Status Assessment Patient has had a recent decline in their functional status and demonstrates the ability to make significant improvements in function in a reasonable and predictable amount of time.     Precautions / Restrictions Precautions Precautions: None Restrictions Weight Bearing Restrictions: No Other Position/Activity Restrictions: WBAT      Mobility  Bed Mobility Overal bed mobility: Needs Assistance Bed Mobility: Supine to Sit, Sit to Supine     Supine to sit: Supervision, HOB elevated Sit to supine: Supervision, HOB elevated   General bed mobility comments: Supv for safety, lines.    Transfers Overall transfer level: Needs assistance Equipment used: Rolling  walker (2 wheels) Transfers: Sit to/from Stand Sit to Stand: Supervision           General transfer comment: Supv for safety.    Ambulation/Gait Ambulation/Gait assistance: Supervision Gait Distance (Feet): 150 Feet   Gait Pattern/deviations: Step-through pattern, Decreased stride length       General Gait Details: Supv for safety. Pt denied dizziness. Tolerated distance well. Cues for R LE/foot positioning throughout distance-observed increased IR of R hip.  Stairs            Wheelchair Mobility    Modified Rankin (Stroke Patients Only)       Balance Overall balance assessment: Needs assistance         Standing balance support: During functional activity Standing balance-Leahy Scale: Fair                               Pertinent Vitals/Pain Pain Assessment Pain Assessment: Faces Faces Pain Scale: Hurts little more Pain Location: R hip/thigh Pain Descriptors / Indicators: Discomfort, Sore Pain Intervention(s): Limited activity within patient's tolerance, Monitored during session, Ice applied, Repositioned    Home Living Family/patient expects to be discharged to:: Private residence Living Arrangements: Spouse/significant other;Other relatives Available Help at Discharge: Family Type of Home: House Home Access: Stairs to enter Entrance Stairs-Rails: Right Entrance Stairs-Number of Steps: 3 Alternate Level Stairs-Number of Steps: 1 flight Home Layout: Two level;Bed/bath upstairs Home Equipment: Rolling Walker (2 wheels);Cane - quad;Cane - single point      Prior Function Prior Level of Function : Independent/Modified Independent             Mobility Comments: using  cane prior to surgery       Hand Dominance        Extremity/Trunk Assessment   Upper Extremity Assessment Upper Extremity Assessment: Overall WFL for tasks assessed    Lower Extremity Assessment Lower Extremity Assessment: Generalized weakness    Cervical  / Trunk Assessment Cervical / Trunk Assessment: Normal  Communication   Communication: No difficulties  Cognition Arousal/Alertness: Awake/alert Behavior During Therapy: WFL for tasks assessed/performed Overall Cognitive Status: Within Functional Limits for tasks assessed                                          General Comments      Exercises     Assessment/Plan    PT Assessment Patient needs continued PT services  PT Problem List Decreased strength;Decreased range of motion;Decreased activity tolerance;Decreased balance;Decreased mobility;Pain;Decreased knowledge of use of DME       PT Treatment Interventions DME instruction;Therapeutic exercise;Gait training;Stair training;Balance training;Functional mobility training;Therapeutic activities;Patient/family education    PT Goals (Current goals can be found in the Care Plan section)  Acute Rehab PT Goals Patient Stated Goal: home. regain plof/independence PT Goal Formulation: With patient/family Time For Goal Achievement: 08/02/22 Potential to Achieve Goals: Good    Frequency 7X/week     Co-evaluation               AM-PAC PT "6 Clicks" Mobility  Outcome Measure Help needed turning from your back to your side while in a flat bed without using bedrails?: A Little Help needed moving from lying on your back to sitting on the side of a flat bed without using bedrails?: A Little Help needed moving to and from a bed to a chair (including a wheelchair)?: A Little Help needed standing up from a chair using your arms (e.g., wheelchair or bedside chair)?: A Little Help needed to walk in hospital room?: A Little Help needed climbing 3-5 steps with a railing? : A Little 6 Click Score: 18    End of Session Equipment Utilized During Treatment: Gait belt Activity Tolerance: Patient tolerated treatment well Patient left: in bed;with call bell/phone within reach;with bed alarm set;with family/visitor present    PT Visit Diagnosis: Other abnormalities of gait and mobility (R26.89);Pain Pain - Right/Left: Right Pain - part of body: Hip    Time: 5400-8676 PT Time Calculation (min) (ACUTE ONLY): 20 min   Charges:   PT Evaluation $PT Eval Low Complexity: Stacy, PT Acute Rehabilitation  Office: [phone]

## 2022-07-20 DIAGNOSIS — M1611 Unilateral primary osteoarthritis, right hip: Secondary | ICD-10-CM | POA: Diagnosis not present

## 2022-07-20 LAB — BASIC METABOLIC PANEL
Anion gap: 9 (ref 5–15)
BUN: 23 mg/dL (ref 8–23)
CO2: 20 mmol/L — ABNORMAL LOW (ref 22–32)
Calcium: 7.6 mg/dL — ABNORMAL LOW (ref 8.9–10.3)
Chloride: 104 mmol/L (ref 98–111)
Creatinine, Ser: 0.73 mg/dL (ref 0.61–1.24)
GFR, Estimated: >60 mL/min (ref >60)
Glucose, Bld: 134 mg/dL — ABNORMAL HIGH (ref 70–99)
Potassium: 4.2 mmol/L (ref 3.5–5.1)
Sodium: 133 mmol/L — ABNORMAL LOW (ref 135–145)

## 2022-07-20 LAB — CBC
HCT: 36.7 % — ABNORMAL LOW (ref 39.0–52.0)
Hemoglobin: 12.6 g/dL — ABNORMAL LOW (ref 13.0–17.0)
MCH: 34.1 pg — ABNORMAL HIGH (ref 26.0–34.0)
MCHC: 34.3 g/dL (ref 30.0–36.0)
MCV: 99.2 fL (ref 80.0–100.0)
Platelets: 130 10*3/uL — ABNORMAL LOW (ref 150–400)
RBC: 3.7 MIL/uL — ABNORMAL LOW (ref 4.22–5.81)
RDW: 12.6 % (ref 11.5–15.5)
WBC: 11.3 10*3/uL — ABNORMAL HIGH (ref 4.0–10.5)
nRBC: 0 % (ref 0.0–0.2)

## 2022-07-20 MED ORDER — SENNA 8.6 MG PO TABS: 1.0000 | ORAL_TABLET | Freq: Every day | ORAL | 0 refills | Status: AC

## 2022-07-20 MED ORDER — METHOCARBAMOL 500 MG PO TABS: 500.0000 mg | ORAL_TABLET | Freq: Four times a day (QID) | ORAL | 2 refills | Status: AC | PRN

## 2022-07-20 MED ORDER — POLYETHYLENE GLYCOL 3350 17 G PO PACK: 17.0000 g | PACK | Freq: Two times a day (BID) | ORAL | 0 refills | Status: AC

## 2022-07-20 MED ORDER — HYDROCODONE-ACETAMINOPHEN 5-325 MG PO TABS: 1.0000 | ORAL_TABLET | ORAL | 0 refills | Status: AC | PRN

## 2022-07-20 NOTE — Progress Notes (Signed)
Physical Therapy Treatment Patient Details Name: Andre Parker MRN: [id number] DOB: Jul 11, 1945 Today's Date: 07/20/2022   History of Present Illness 77 yo male s/p R THA-DA 07/19/22. Hx of Afib, comp fx    PT Comments    Progressing with mobility. Reviewed exercises, gait training and stair training. Issued HEP for pt to perform at least 2x/day as tolerated. Encouraged pt to ambulate often as tolerated. All PT education completed.    Recommendations for follow up therapy are one component of a multi-disciplinary discharge planning process, led by the attending physician.  Recommendations may be updated based on patient status, additional functional criteria and insurance authorization.  Follow Up Recommendations  Follow physician's recommendations for discharge plan and follow up therapies     Assistance Recommended at Discharge PRN  Patient can return home with the following Assistance with cooking/housework;Assist for transportation;Help with stairs or ramp for entrance;A little help with bathing/dressing/bathroom   Equipment Recommendations  None recommended by PT    Recommendations for Other Services       Precautions / Restrictions Precautions Precautions: None Restrictions Weight Bearing Restrictions: No Other Position/Activity Restrictions: WBAT     Mobility  Bed Mobility         Supine to sit: Supervision, HOB elevated Sit to supine: Supervision, HOB elevated   General bed mobility comments: Supv for safety, lines. Pt used the gait belt as leg lifter. Increased time.    Transfers Overall transfer level: Needs assistance Equipment used: Rolling walker (2 wheels) Transfers: Sit to/from Stand Sit to Stand: Supervision           General transfer comment: Supv for safety.    Ambulation/Gait Ambulation/Gait assistance: Supervision Gait Distance (Feet): 150 Feet Assistive device: Rolling walker (2 wheels) Gait Pattern/deviations: Step-through pattern,  Decreased stride length       General Gait Details: Supv for safety. Pt denied dizziness. Tolerated distance well. Cues for R LE/foot positioning throughout distance-observed increased IR of R hip.   Stairs Stairs: Yes Stairs assistance: Min guard Stair Management: Step to pattern, Forwards, One rail Left Number of Stairs: 3 General stair comments: Min guard for safety. Practiced x 1 going up down 3 step with use of 1 rail. Practiced x 1 with RW on platform step. Informed pt that he could use 1 rail, 1 cane if necessary (to offer increased support). Increased time. Cues for safety, technique, sequence.   Wheelchair Mobility    Modified Rankin (Stroke Patients Only)       Balance Overall balance assessment: Needs assistance         Standing balance support: During functional activity, Reliant on assistive device for balance Standing balance-Leahy Scale: Fair                              Cognition Arousal/Alertness: Awake/alert Behavior During Therapy: WFL for tasks assessed/performed Overall Cognitive Status: Within Functional Limits for tasks assessed                                          Exercises Total Joint Exercises Ankle Circles/Pumps: AROM, Both, 10 reps Quad Sets: AROM, Both, 10 reps Heel Slides: AAROM, Right, 10 reps (pt used gait belt) Hip ABduction/ADduction: AAROM, Right, 10 reps, Standing (pt used gait belt) Knee Flexion: AROM, Right, 10 reps, Standing Marching in Standing: AROM, Both, 10 reps, Standing General  Exercises - Lower Extremity Heel Raises: AROM, Both, 10 reps, Standing    General Comments        Pertinent Vitals/Pain Pain Assessment Pain Assessment: Faces Faces Pain Scale: Hurts even more Pain Location: R hip/thigh Pain Descriptors / Indicators: Discomfort, Sore Pain Intervention(s): Monitored during session, Repositioned, Ice applied    Home Living                          Prior  Function            PT Goals (current goals can now be found in the care plan section) Progress towards PT goals: Progressing toward goals    Frequency    7X/week      PT Plan Current plan remains appropriate    Co-evaluation              AM-PAC PT "6 Clicks" Mobility   Outcome Measure  Help needed turning from your back to your side while in a flat bed without using bedrails?: A Little Help needed moving from lying on your back to sitting on the side of a flat bed without using bedrails?: A Little Help needed moving to and from a bed to a chair (including a wheelchair)?: A Little Help needed standing up from a chair using your arms (e.g., wheelchair or bedside chair)?: A Little Help needed to walk in hospital room?: A Little Help needed climbing 3-5 steps with a railing? : A Little 6 Click Score: 18    End of Session Equipment Utilized During Treatment: Gait belt Activity Tolerance: Patient tolerated treatment well Patient left: in bed;with call bell/phone within reach;with family/visitor present   PT Visit Diagnosis: Other abnormalities of gait and mobility (R26.89);Pain Pain - Right/Left: Right Pain - part of body: Hip     Time: 0865-7846 PT Time Calculation (min) (ACUTE ONLY): 48 min  Charges:  $Gait Training: 23-37 mins $Therapeutic Exercise: 8-22 mins                         Doreatha Massed, PT Acute Rehabilitation  Office: [phone]

## 2022-07-20 NOTE — Progress Notes (Signed)
   Subjective: 1 Day Post-Op Procedure(s) (LRB): TOTAL HIP ARTHROPLASTY ANTERIOR APPROACH (Right) Patient reports pain as mild.   Patient seen in rounds with Dr. Alvan Dame. Patient is resting in bed on exam this morning. No acute events overnight. Foley catheter removed. Patient ambulated 150 feet with PT yesterday. We will continue therapy today.   Objective: Vital signs in last 24 hours: Temp:  [96.8 F (36 C)-98.3 F (36.8 C)] 98.1 F (36.7 C) (02/07 0558) Pulse Rate:  [69-96] 92 (02/07 0558) Resp:  [11-20] 17 (02/07 0558) BP: (95-114)/(67-82) 112/82 (02/07 0558) SpO2:  [94 %-100 %] 98 % (02/07 0558)  Intake/Output from previous day:  Intake/Output Summary (Last 24 hours) at 07/20/2022 0750 Last data filed at 07/20/2022 0600 Gross per 24 hour  Intake 3648.5 ml  Output 1950 ml  Net 1698.5 ml     Intake/Output this shift: No intake/output data recorded.  Labs: Recent Labs    07/20/22 0328  HGB 12.6*   Recent Labs    07/20/22 0328  WBC 11.3*  RBC 3.70*  HCT 36.7*  PLT 130*   Recent Labs    07/20/22 0328  NA 133*  K 4.2  CL 104  CO2 20*  BUN 23  CREATININE 0.73  GLUCOSE 134*  CALCIUM 7.6*   No results for input(s): "LABPT", "INR" in the last 72 hours.  Exam: General - Patient is Alert and Oriented Extremity - Neurologically intact Sensation intact distally Intact pulses distally Dorsiflexion/Plantar flexion intact Dressing - dressing C/D/I Motor Function - intact, moving foot and toes well on exam.   Past Medical History:  Diagnosis Date   Dysrhythmia 2018   A-Fib   GERD (gastroesophageal reflux disease)    no meds   Low back pain     Assessment/Plan: 1 Day Post-Op Procedure(s) (LRB): TOTAL HIP ARTHROPLASTY ANTERIOR APPROACH (Right) Principal Problem:   S/P total right hip arthroplasty  Estimated body mass index is 24.67 kg/m as calculated from the following:   Height as of this encounter: 5\' 10"  (1.778 m).   Weight as of this encounter:  78 kg. Advance diet Up with therapy D/C IV fluids  DVT Prophylaxis - Aspirin Weight bearing as tolerated.  Hgb stable at 12.6 this AM.  Plan is to go Home after hospital stay. Plan for discharge today after meeting goals with therapy. Follow up in the office in 2 weeks.   Griffith Citron, PA-C Orthopedic Surgery [phone] 07/20/2022, 7:50 AM

## 2022-07-20 NOTE — TOC Transition Note (Signed)
Transition of Care [facility]) - CM/SW Discharge Note   Patient Details  Name: Andre Parker MRN: [id number] Date of Birth: 17-Nov-1945  [facility] CM/SW Contact:  Lennart Pall, LCSW Phone Number: 07/20/2022, 9:38 AM   Clinical Narrative:     Met with pt and spouse and confirming he has needed DME at home.  Plan for HEP.  No TOC needs.  Final next level of care: Home/Self Care Barriers to Discharge: No Barriers Identified   Patient Goals and CMS Choice      Discharge Placement                         Discharge Plan and Services Additional resources added to the After Visit Summary for                  DME Arranged: N/A DME Agency: NA                  Social Determinants of Health (SDOH) Interventions SDOH Screenings   Food Insecurity: No Food Insecurity (07/19/2022)  Housing: Low Risk  (07/19/2022)  Transportation Needs: No Transportation Needs (07/19/2022)  Utilities: Not At Risk (07/19/2022)  Tobacco Use: Low Risk  (07/19/2022)     Readmission Risk Interventions     No data to display

## 2022-07-20 NOTE — Plan of Care (Signed)

## 2022-07-20 NOTE — Care Management Important Message (Signed)
Important Message  Patient Details IM Letter given. Name: Andre Parker MRN: [id number] Date of Birth: [birth year]   Medicare Important Message Given:  Yes     Kerin Salen 07/20/2022, 10:26 AM

## 2022-07-21 ENCOUNTER — Encounter (HOSPITAL_COMMUNITY): Payer: Self-pay | Admitting: Orthopedic Surgery

## 2022-07-21 NOTE — Discharge Summary (Signed)
Patient ID: Andre Parker MRN: [id number] DOB/AGE: 78-20-1947 78 y.o.  Admit date: 07/19/2022 Discharge date: 07/20/2022  Admission Diagnoses:  Right hip osteoarthritis  Discharge Diagnoses:  Principal Problem:   S/P total right hip arthroplasty   Past Medical History:  Diagnosis Date   Dysrhythmia 2018   A-Fib   GERD (gastroesophageal reflux disease)    no meds   Low back pain     Surgeries: Procedure(s): TOTAL HIP ARTHROPLASTY ANTERIOR APPROACH on 07/19/2022   Consultants:   Discharged Condition: Improved  Hospital Course: Andre Parker is an 77 y.o. male who was admitted 07/19/2022 for operative treatment ofS/P total right hip arthroplasty. Patient has severe unremitting pain that affects sleep, daily activities, and work/hobbies. After pre-op clearance the patient was taken to the operating room on 07/19/2022 and underwent  Procedure(s): TOTAL HIP ARTHROPLASTY ANTERIOR APPROACH.    Patient was given perioperative antibiotics:  Anti-infectives (From admission, onward)    Start     Dose/Rate Route Frequency Ordered Stop   07/19/22 1500  ceFAZolin (ANCEF) IVPB 2g/100 mL premix        2 g 200 mL/hr over 30 Minutes Intravenous Every 6 hours 07/19/22 1244 07/19/22 2136   07/19/22 0645  ceFAZolin (ANCEF) IVPB 2g/100 mL premix        2 g 200 mL/hr over 30 Minutes Intravenous On call to O.R. 07/19/22 [id number] 07/19/22 [id number]        Patient was given sequential compression devices, early ambulation, and chemoprophylaxis to prevent DVT. Patient worked with PT and was meeting their goals regarding safe ambulation and transfers.  Patient benefited maximally from hospital stay and there were no complications.    Recent vital signs: No data found.   Recent laboratory studies:  Recent Labs    07/20/22 0328  WBC 11.3*  HGB 12.6*  HCT 36.7*  PLT 130*  NA 133*  K 4.2  CL 104  CO2 20*  BUN 23  CREATININE 0.73  GLUCOSE 134*  CALCIUM 7.6*     Discharge Medications:   Allergies as  of 07/20/2022   No Known Allergies      Medication List     TAKE these medications    apixaban 5 MG Tabs tablet Commonly known as: Eliquis Take 1 tablet (5 mg total) by mouth 2 (two) times daily.   CAL-CITRATE PO Take 1 tablet by mouth in the morning and at bedtime.   chlorpheniramine 4 MG tablet Commonly known as: CHLOR-TRIMETON Take 4 mg by mouth every 6 (six) hours as needed for allergies.   ferrous sulfate 325 (65 FE) MG tablet Take 325 mg by mouth at bedtime.   Fish Oil 1000 MG Cpdr Take 1,000 mg by mouth daily.   gabapentin 300 MG capsule Commonly known as: NEURONTIN Take 300 mg by mouth 3 (three) times daily.   HYDROcodone-acetaminophen 5-325 MG tablet Commonly known as: NORCO/VICODIN Take 1 tablet by mouth every 4 (four) hours as needed for severe pain.   Magnesium 250 MG Tabs Take 250 mg by mouth in the morning and at bedtime.   methocarbamol 500 MG tablet Commonly known as: ROBAXIN Take 1 tablet (500 mg total) by mouth every 6 (six) hours as needed for muscle spasms (muscle pain).   multivitamin tablet Take 1 tablet by mouth daily.   polyethylene glycol 17 g packet Commonly known as: MIRALAX / GLYCOLAX Take 17 g by mouth 2 (two) times daily.   research study medication Atorvastatin 29m or placebo daily. [facility]  senna 8.6 MG Tabs tablet Commonly known as: SENOKOT Take 1 tablet (8.6 mg total) by mouth at bedtime for 14 days.   VITAMIN C PO Take 1 tablet by mouth daily.   Vitamin D3 25 MCG (1000 UT) Caps Take 1,000 Units by mouth at bedtime.               Discharge Care Instructions  (From admission, onward)           Start     Ordered   07/20/22 0000  Change dressing       Comments: Maintain surgical dressing until follow up in the clinic. If the edges start to pull up, may reinforce with tape. If the dressing is no longer working, may remove and cover with gauze and tape, but must keep the area dry and clean.  Call  with any questions or concerns.   07/20/22 0755            Diagnostic Studies: DG Pelvis Portable  Result Date: 07/19/2022 CLINICAL DATA:  Status post hip arthroplasty EXAM: PORTABLE PELVIS 1 VIEWS COMPARISON:  None Available. FINDINGS: Postsurgical changes from right hip arthroplasty. Hardware appears intact and well seated. No acute fracture or dislocation. IMPRESSION: Postsurgical changes from right hip arthroplasty without hardware complication. Electronically Signed   By: Darrin Nipper M.D.   On: 07/19/2022 10:55   DG HIP UNILAT WITH PELVIS 2-3 VIEWS RIGHT  Result Date: 07/19/2022 CLINICAL DATA:  Right hip replacement. EXAM: DG HIP (WITH OR WITHOUT PELVIS) 2-3V RIGHT COMPARISON:  No recent prior. FINDINGS: Total right hip replacement. Hardware intact. Anatomic alignment. Fluoroscopy time 0 minutes 10 seconds. Radiation dose 0.7445 mGy. IMPRESSION: Total right hip replacement with anatomic alignment. Electronically Signed   By: Marcello Moores  Register M.D.   On: 07/19/2022 10:29   DG C-Arm 1-60 Min-No Report  Result Date: 07/19/2022 Fluoroscopy was utilized by the requesting physician.  No radiographic interpretation.   DG C-Arm 1-60 Min-No Report  Result Date: 07/19/2022 Fluoroscopy was utilized by the requesting physician.  No radiographic interpretation.    Disposition: Discharge disposition: 01-Home or Self Care       Discharge Instructions     Call MD / Call 911   Complete by: As directed    If you experience chest pain or shortness of breath, CALL 911 and be transported to the hospital emergency room.  If you develope a fever above 101 F, pus (white drainage) or increased drainage or redness at the wound, or calf pain, call your surgeon's office.   Change dressing   Complete by: As directed    Maintain surgical dressing until follow up in the clinic. If the edges start to pull up, may reinforce with tape. If the dressing is no longer working, may remove and cover with gauze and  tape, but must keep the area dry and clean.  Call with any questions or concerns.   Constipation Prevention   Complete by: As directed    Drink plenty of fluids.  Prune juice may be helpful.  You may use a stool softener, such as Colace (over the counter) 100 mg twice a day.  Use MiraLax (over the counter) for constipation as needed.   Diet - low sodium heart healthy   Complete by: As directed    Increase activity slowly as tolerated   Complete by: As directed    Weight bearing as tolerated with assist device (walker, cane, etc) as directed, use it as long as suggested by your  surgeon or therapist, typically at least 4-6 weeks.   Post-operative opioid taper instructions:   Complete by: As directed    POST-OPERATIVE OPIOID TAPER INSTRUCTIONS: It is important to wean off of your opioid medication as soon as possible. If you do not need pain medication after your surgery it is ok to stop day one. Opioids include: Codeine, Hydrocodone(Norco, Vicodin), Oxycodone(Percocet, oxycontin) and hydromorphone amongst others.  Long term and even short term use of opiods can cause: Increased pain response Dependence Constipation Depression Respiratory depression And more.  Withdrawal symptoms can include Flu like symptoms Nausea, vomiting And more Techniques to manage these symptoms Hydrate well Eat regular healthy meals Stay active Use relaxation techniques(deep breathing, meditating, yoga) Do Not substitute Alcohol to help with tapering If you have been on opioids for less than two weeks and do not have pain than it is ok to stop all together.  Plan to wean off of opioids This plan should start within one week post op of your joint replacement. Maintain the same interval or time between taking each dose and first decrease the dose.  Cut the total daily intake of opioids by one tablet each day Next start to increase the time between doses. The last dose that should be eliminated is the evening  dose.      TED hose   Complete by: As directed    Use stockings (TED hose) for 2 weeks on both leg(s).  You may remove them at night for sleeping.        Follow-up Information     Paralee Cancel, MD. Schedule an appointment as soon as possible for a visit in 2 week(s).   Specialty: Orthopedic Surgery Contact information: [address] [place] [address] [phone]                  Signed: Irving Parker 07/21/2022, 3:23 PM

## 2022-08-01 ENCOUNTER — Other Ambulatory Visit: Payer: Self-pay | Admitting: Cardiology

## 2022-08-01 NOTE — Telephone Encounter (Signed)
Prescription refill request for Eliquis received. Indication: AF Last office visit:  04/15/22  Vita Barley MD Scr: 0.73 on 07/20/22 Age: 77 Weight: 75.8kg  Based on above findings Eliquis 66m twice daily is the appropriate dose.  Refill approved.

## 2023-02-21 ENCOUNTER — Other Ambulatory Visit: Payer: Self-pay | Admitting: Cardiology

## 2023-02-21 DIAGNOSIS — I4819 Other persistent atrial fibrillation: Secondary | ICD-10-CM

## 2023-02-21 NOTE — Telephone Encounter (Signed)
Prescription refill request for Eliquis received. Indication: Afib  Last office visit: 04/15/22 (Hoch[place])  Scr:0.73 (07/20/22)   Age: 77 Weight: 78kg  Appropriate dose. Refill sent.

## 2023-03-14 ENCOUNTER — Ambulatory Visit: Payer: Self-pay | Admitting: General Surgery

## 2023-03-14 ENCOUNTER — Telehealth: Payer: Self-pay

## 2023-03-14 NOTE — H&P (Signed)
Chief Complaint: New Patient (Right ing hernia )       History of Present Illness: Andre Parker is a 77 y.o. male who is seen today as an office consultation at the request of Dr. Linwood Dibbles for evaluation of New Patient (Right ing hernia ) .   Patient is a 77 year old male who comes in with a history of A-fib-on Eliquis, with a right inguinal hernia.  He states he is notices 2 to 3 weeks ago.  He states he was pushing a lawn more and thereafter noticed a bulge in the right inguinal area.  He states that he has been using a hernia truss since that time.  He had no significant pain or discomfort.   Patient states he had no signs or symptoms of incarceration or strangulation. 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         Review of Systems: A complete review of systems was obtained from the patient.  I have reviewed this information and discussed as appropriate with the patient.  See HPI as well for other ROS.   Review of Systems  Constitutional:  Negative for fever.  HENT:  Negative for congestion.   Eyes:  Negative for blurred vision.  Respiratory:  Negative for cough, shortness of breath and wheezing.   Cardiovascular:  Negative for chest pain and palpitations.  Gastrointestinal:  Negative for heartburn.  Genitourinary:  Negative for dysuria.  Musculoskeletal:  Negative for myalgias.  Skin:  Negative for rash.  Neurological:  Negative for dizziness and headaches.  Psychiatric/Behavioral:  Negative for depression and suicidal ideas.   All other systems reviewed and are negative.       Medical History: Past Medical History History reviewed. No pertinent past medical history.     Problem List There is no problem list on file for this patient.     Past Surgical History History reviewed. No pertinent surgical history.     Allergies No Known Allergies    Medications Ordered Prior to Encounter Current Outpatient Medications on File Prior to  Visit Medication Sig Dispense Refill  calcium citrate (CALCITRATE) 200 mg (950 mg) tablet Take by mouth      cyclobenzaprine (FLEXERIL) 5 MG tablet Take 1 tablet by mouth at bedtime as needed      ELIQUIS 5 mg tablet Take 1 tablet by mouth 2 (two) times daily      ferrous sulfate 325 (65 FE) MG EC tablet Take 325 mg by mouth daily with breakfast      magnesium 250 mg Tab Take by mouth      DOCOSAHEXAENOIC ACID ORAL Take 1 g by mouth once daily (Patient not taking: Reported on 03/14/2023)        No current facility-administered medications on file prior to visit.      Family History History reviewed. No pertinent family history.     Tobacco Use History Social History    Tobacco Use Smoking Status Never Smokeless Tobacco Never      Social History Social History    Socioeconomic History  Marital status: Married Tobacco Use  Smoking status: Never  Smokeless tobacco: Never Substance and Sexual Activity  Alcohol use: Never  Drug use: Never    Social Determinants of Health    Food Insecurity: No Food Insecurity (07/19/2022)   [facility]   Hunger Vital Sign    Worried About Running Out of Food in the Last Year: Never true    Ran Out of Food in the Last  Year: Never true Transportation Needs: No Transportation Needs (07/19/2022)   Received from [facility] - Transportation    Lack of Transportation (Medical): No    Lack of Transportation (Non-Medical): No      Objective:     Vitals:   03/14/23 1427 BP: 118/80 Pulse: 88 Temp: 36.4 C (97.6 F) SpO2: 98% Weight: 75.4 kg (166 lb 3.2 oz) Height: 179.1 cm (5' 10.5")   Body mass index is 23.51 kg/m. Physical Exam Constitutional:      Appearance: Normal appearance.  HENT:     Head: Normocephalic and atraumatic.     Nose: Nose normal. No congestion.     Mouth/Throat:     Mouth: Mucous membranes are moist.     Pharynx: Oropharynx is clear.  Eyes:     Pupils: Pupils are equal, round,  and reactive to light.  Cardiovascular:     Rate and Rhythm: Normal rate and regular rhythm.     Pulses: Normal pulses.     Heart sounds: Normal heart sounds. No murmur heard.    No friction rub. No gallop.  Pulmonary:     Effort: Pulmonary effort is normal. No respiratory distress.     Breath sounds: Normal breath sounds. No stridor. No wheezing, rhonchi or rales.  Abdominal:     General: Abdomen is flat.     Hernia: A hernia is present. Hernia is present in the left inguinal area and right inguinal area.  Musculoskeletal:        General: Normal range of motion.     Cervical back: Normal range of motion.  Skin:    General: Skin is warm and dry.  Neurological:     General: No focal deficit present.     Mental Status: He is alert and oriented to person, place, and time.  Psychiatric:        Mood and Affect: Mood normal.        Thought Content: Thought content normal.          Assessment and Plan: Diagnoses and all orders for this visit:   Unilateral inguinal hernia without obstruction or gangrene, recurrence not specified   Bilateral inguinal hernia without obstruction or gangrene, recurrence not specified     Andre Parker is a 77 y.o. male    1.  We will proceed to the OR for a laparoscopic bilateral inguinal hernia repair with mesh. 2. All risks and benefits were discussed with the patient, to generally include infection, bleeding, damage to surrounding structures, acute and chronic nerve pain, and recurrence. Alternatives were offered and described.  All questions were answered and the patient voiced understanding of the procedure and wishes to proceed at this point.             No follow-ups on file.   Axel Filler, MD, [facility] Surgery, [place] [facility]

## 2023-03-14 NOTE — Telephone Encounter (Signed)
   Name: Andre Parker  DOB: [birth year]  MRN: [id number]  Primary Cardiologist: Rollene Rotunda, MD  Chart reviewed as part of pre-operative protocol coverage. Because of Andre Parker past medical history and time since last visit, he will require a follow-up in-office visit in order to better assess preoperative cardiovascular risk.  Pre-op covering staff: - Please schedule appointment and call patient to inform them. If patient already had an upcoming appointment within acceptable timeframe, please add "pre-op clearance" to the appointment notes so provider is aware. - Please contact requesting surgeon's office via preferred method (i.e, phone, fax) to inform them of need for appointment prior to surgery.     Napoleon Form, Leodis Rains, NP  03/14/2023, 3:59 PM

## 2023-03-14 NOTE — Telephone Encounter (Signed)
   Pre-operative Risk Assessment    Patient Name: Andre Parker  DOB: [birth year] MRN: [id number]      Request for Surgical Clearance    Procedure:   Hernia Surgery  Date of Surgery:  Clearance TBD                                 Surgeon:  Axel Filler, MD Surgeon's Group or Practice Name:  [facility] Phone number:  [phone] Fax number:  [phone]   Type of Clearance Requested:   - Medical  - Pharmacy:  Hold Apixaban (Eliquis)     Type of Anesthesia:  General    Additional requests/questions:    Garrel Ridgel   03/14/2023, 3:49 PM

## 2023-03-15 NOTE — Telephone Encounter (Signed)
Pt has an upcoming in-office appt 11/5 with Dr. Antoine Poche. I have updated appt notes.

## 2023-03-31 ENCOUNTER — Telehealth: Payer: Self-pay | Admitting: Cardiology

## 2023-03-31 NOTE — Telephone Encounter (Signed)
Patient requested a sooner appointment for clearance. Pt scheduled to see Azalee Course, PA-C on 04/04/23 for preop clearance

## 2023-03-31 NOTE — Telephone Encounter (Signed)
Patient wants a call back on the status of his clearance.

## 2023-04-04 ENCOUNTER — Ambulatory Visit: Payer: Medicare Other | Admitting: Physician Assistant

## 2023-04-15 DIAGNOSIS — D6869 Other thrombophilia: Secondary | ICD-10-CM | POA: Insufficient documentation

## 2023-04-15 NOTE — Progress Notes (Unsigned)
  Cardiology Office Note:   Date:  04/18/2023  ID:  Andre Parker, DOB March 16, 1946, MRN [id number] PCP: Bernita Buffy  [facility] Providers Cardiologist:  Rollene Rotunda, MD {  History of Present Illness:   Andre Parker is a 77 y.o. male who presents for evaluation of atrial fib.  He has had a Holter with persistent atrial fib with good rate control.  His echo was normal.   Stress perfusion study was normal om 2020.     Since I last saw him he had hip replacement.  He did well with this.  He is in need of inguinal hernia repair.  He has had joint problems but he still stays active.  He does not singing.  The patient denies any new symptoms such as chest discomfort, neck or arm discomfort. There has been no new shortness of breath, PND or orthopnea. There have been no reported palpitations, presyncope or syncope.   ROS: As stated in the HPI and negative for all other systems.  Studies Reviewed:    EKG:   EKG Interpretation Date/Time:  Tuesday April 18 2023 16:07:34 EST Ventricular Rate:  81 PR Interval:    QRS Duration:  144 QT Interval:  384 QTC Calculation: 446 R Axis:   78  Text Interpretation: Atrial fibrillation Right bundle branch block No significant change since last tracing Confirmed by Rollene Rotunda ([id number]) on 04/18/2023 4:27:20 PM    Risk Assessment/Calculations:    CHA2DS2-VASc Score = 2   This indicates a 2.2% annual risk of stroke. The patient's score is based upon: CHF History: 0 HTN History: 0 Diabetes History: 0 Stroke History: 0 Vascular Disease History: 0 Age Score: 2 Gender Score: 0        Physical Exam:   VS:  BP (!) 96/58 (BP Location: Left Arm, Patient Position: Sitting, Cuff Size: Normal)   Pulse 81   Ht 5' 10.5" (1.791 m)   Wt 167 lb (75.8 kg)   SpO2 97%   BMI 23.62 kg/m    Wt Readings from Last 3 Encounters:  04/18/23 167 lb (75.8 kg)  07/19/22 171 lb 15.3 oz (78 kg)  07/07/22 174 lb (78.9 kg)     GEN: Well  nourished, well developed in no acute distress NECK: No JVD; No carotid bruits CARDIAC: Irregular RR, no murmurs, rubs, gallops RESPIRATORY:  Clear to auscultation without rales, wheezing or rhonchi  ABDOMEN: Soft, non-tender, non-distended EXTREMITIES:  No edema; No deformity   ASSESSMENT AND PLAN:   ATRIAL FIB:  [name] has a CHA2DS2 - VASc score of  2.  He has good rate control.  He denies palpitations and does not really notice his fibrillation.  He tolerates his anticoagulation.  No change in therapy.  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  No change in therapy.  PREOP: The patient is at acceptable risk for the planned procedure without further cardiovascular testing necessary according to ACC/AHA guidelines.  He can hold his Eliquis for 2 days prior to the procedure and resume as directed by the surgeon.       Follow up with me in one year.  Signed, Rollene Rotunda, MD

## 2023-04-18 ENCOUNTER — Ambulatory Visit: Payer: Medicare Other | Attending: Cardiology | Admitting: Cardiology

## 2023-04-18 ENCOUNTER — Encounter: Payer: Self-pay | Admitting: Cardiology

## 2023-04-18 VITALS — BP 96/58 | HR 81 | Ht 70.5 in | Wt 167.0 lb

## 2023-04-18 DIAGNOSIS — I4821 Permanent atrial fibrillation: Secondary | ICD-10-CM

## 2023-04-18 DIAGNOSIS — I4510 Unspecified right bundle-branch block: Secondary | ICD-10-CM | POA: Diagnosis not present

## 2023-04-18 DIAGNOSIS — D6869 Other thrombophilia: Secondary | ICD-10-CM | POA: Diagnosis not present

## 2023-04-18 NOTE — Patient Instructions (Signed)

## 2023-06-19 NOTE — Pre-Procedure Instructions (Signed)
 Surgical Instructions   Your procedure is scheduled on June 26, 2023. Report to [facility] Main Entrance A at 5:30 A.M., then check in with the Admitting office. Any questions or running late day of surgery: call [phone]  Questions prior to your surgery date: call [phone], Monday-Friday, 8am-4pm. If you experience any cold or flu symptoms such as cough, fever, chills, shortness of breath, etc. between now and your scheduled surgery, please notify us  at the above number.     Remember:  Do not eat after midnight the night before your surgery   You may drink clear liquids until 4:30 AM the morning of your surgery.   Clear liquids allowed are: Water , Non-Citrus Juices (without pulp), Carbonated Beverages, Clear Tea (no milk, honey, etc.), Black Coffee Only (NO MILK, CREAM OR POWDERED CREAMER of any kind), and Gatorade.  Patient Instructions  The night before surgery:  No food after midnight. ONLY clear liquids after midnight  The day of surgery (if you do NOT have diabetes):  Drink ONE (1) Pre-Surgery Clear Ensure by 4:30 AM the morning of surgery. Drink in one sitting. Do not sip.  This drink was given to you during your hospital  pre-op appointment visit.  Nothing else to drink after completing the  Pre-Surgery Clear Ensure.         If you have questions, please contact your surgeon's office.    Take these medicines the morning of surgery with A SIP OF WATER : NONE   STOP taking your apixaban  (ELIQUIS ) two days prior to surgery. Your last dose will be January 10th.   One week prior to surgery, STOP taking any Aspirin (unless otherwise instructed by your surgeon) Aleve, Naproxen, Ibuprofen, Motrin, Advil, Goody's, BC's, all herbal medications, fish oil, and non-prescription vitamins.                     Do NOT Smoke (Tobacco/Vaping) for 24 hours prior to your procedure.  If you use a CPAP at night, you may bring your mask/headgear for your overnight stay.    You will be asked to remove any contacts, glasses, piercing's, hearing aid's, dentures/partials prior to surgery. Please bring cases for these items if needed.    Patients discharged the day of surgery will not be allowed to drive home, and someone needs to stay with them for 24 hours.  SURGICAL WAITING ROOM VISITATION Patients may have no more than 2 support people in the waiting area - these visitors may rotate.   Pre-op nurse will coordinate an appropriate time for 1 ADULT support person, who may not rotate, to accompany patient in pre-op.  Children under the age of 30 must have an adult with them who is not the patient and must remain in the main waiting area with an adult.  If the patient needs to stay at the hospital during part of their recovery, the visitor guidelines for inpatient rooms apply.  Please refer to the [facility] website for the visitor guidelines for any additional information.   If you received a COVID test during your pre-op visit  it is requested that you wear a mask when out in public, stay away from anyone that may not be feeling well and notify your surgeon if you develop symptoms. If you have been in contact with anyone that has tested positive in the last 10 days please notify you surgeon.      Pre-operative CHG Bathing Instructions   You can play a key role in  reducing the risk of infection after surgery. Your skin needs to be as free of germs as possible. You can reduce the number of germs on your skin by washing with CHG (chlorhexidine  gluconate) soap before surgery. CHG is an antiseptic soap that kills germs and continues to kill germs even after washing.   DO NOT use if you have an allergy to chlorhexidine /CHG or antibacterial soaps. If your skin becomes reddened or irritated, stop using the CHG and notify one of our RNs at [phone].              TAKE A SHOWER THE NIGHT BEFORE SURGERY AND THE DAY OF SURGERY    Please keep in mind the following:   DO NOT shave, including legs and underarms, 48 hours prior to surgery.   You may shave your face before/day of surgery.  Place clean sheets on your bed the night before surgery Use a clean washcloth (not used since being washed) for each shower. DO NOT sleep with pet's night before surgery.  CHG Shower Instructions:  Wash your face and private area with normal soap. If you choose to wash your hair, wash first with your normal shampoo.  After you use shampoo/soap, rinse your hair and body thoroughly to remove shampoo/soap residue.  Turn the water  OFF and apply half the bottle of CHG soap to a CLEAN washcloth.  Apply CHG soap ONLY FROM YOUR NECK DOWN TO YOUR TOES (washing for 3-5 minutes)  DO NOT use CHG soap on face, private areas, open wounds, or sores.  Pay special attention to the area where your surgery is being performed.  If you are having back surgery, having someone wash your back for you may be helpful. Wait 2 minutes after CHG soap is applied, then you may rinse off the CHG soap.  Pat dry with a clean towel  Put on clean pajamas    Additional instructions for the day of surgery: DO NOT APPLY any lotions, deodorants, cologne, or perfumes.   Do not wear jewelry or makeup Do not wear nail polish, gel polish, artificial nails, or any other type of covering on natural nails (fingers and toes) Do not bring valuables to the hospital. [facility] is not responsible for valuables/personal belongings. Put on clean/comfortable clothes.  Please brush your teeth.  Ask your nurse before applying any prescription medications to the skin.

## 2023-06-20 ENCOUNTER — Other Ambulatory Visit: Payer: Self-pay

## 2023-06-20 ENCOUNTER — Encounter (HOSPITAL_COMMUNITY)
Admission: RE | Admit: 2023-06-20 | Discharge: 2023-06-20 | Disposition: A | Discharge status: Home / Self Care | Payer: Medicare Other | Source: Ambulatory Visit | Attending: General Surgery | Admitting: General Surgery

## 2023-06-20 ENCOUNTER — Encounter (HOSPITAL_COMMUNITY): Payer: Self-pay

## 2023-06-20 DIAGNOSIS — Z01812 Encounter for preprocedural laboratory examination: Secondary | ICD-10-CM | POA: Diagnosis present

## 2023-06-20 NOTE — Progress Notes (Signed)
 PCP - Anita Bernardino BROCKS, FNP  Cardiologist -  Lynwood Schilling, MD   PPM/ICD -   denies Device Orders - n/a Rep Notified - n/a  Chest x-ray - denies EKG - denies Stress Test - 08-30-18 ECHO - 08-30-18 Cardiac Cath - denies  Sleep Study - denies CPAP - n/a  Dm - denies  Blood Thinner Instructions: apixaban  (ELIQUIS ) Last dose 06-23-23 Aspirin Instructions: n/a  ERAS Protcol - clear liquid until 4:30   COVID TEST- n/a   Anesthesia review: no  Patient denies shortness of breath, fever, cough and chest pain at PAT appointment   All instructions explained to the patient, with a verbal understanding of the material. Patient agrees to go over the instructions while at home for a better understanding. Patient also instructed to self quarantine after being tested for COVID-19. The opportunity to ask questions was provided.

## 2023-06-21 NOTE — Progress Notes (Signed)
 Anesthesia Chart Review:  78 yo male follows with cardiology for hx of afib. Stress and echo 2020 were normal. Last seen by Dr. Lavona 04/18/23 for preop eval. Per note, The patient is at acceptable risk for the planned procedure without further cardiovascular testing necessary according to ACC/AHA guidelines.  He can hold his Eliquis  for 2 days prior to the procedure and resume as directed by the surgeon.  Preop labs reviewed, mild hyponatremia with sodium 133, mild anemia hgb 12.6, mild thrombocytopenia Plt 130.  EKG 04/18/23: Atrial fibrillation. Rate 81. Right bundle branch block  TTE 08/30/18:  1. The left ventricle has normal systolic function, with an ejection  fraction of 55-60%. The cavity size was normal. Left ventricular diastolic  function could not be evaluated secondary to atrial fibrillation. No  evidence of left ventricular regional  wall motion abnormalities.   2. The right ventricle has normal systolic function. The cavity was  normal. There is no increase in right ventricular wall thickness.   3. Left atrial size was mildly dilated.   4. Right atrial size was mildly dilated.   5. Tricuspid valve regurgitation is mild-moderate.   6. The aortic valve is tricuspid Mild calcification of the aortic valve.   7. The aortic root and ascending aorta are normal in size and structure.   Nuclear stress 08/30/18: Nuclear stress EF: 61%. The left ventricular ejection fraction is normal (55-65%). The study is normal. This is a low risk study.   Normal pharmacologic nuclear stress test with no evidence for a prior infarct or ischemia. Normal LVEF.     Lynwood Geofm RIGGERS [facility] Stay Center/Anesthesiology Phone [phone] 06/21/2023 2:58 PM

## 2023-06-21 NOTE — Anesthesia Preprocedure Evaluation (Addendum)
 Anesthesia Evaluation  Patient identified by MRN, date of birth, ID band Patient awake    Reviewed: Allergy & Precautions, H&P , NPO status , Patient's Chart, lab work & pertinent test results  Airway Mallampati: II   Neck ROM: full    Dental   Pulmonary neg pulmonary ROS   breath sounds clear to auscultation       Cardiovascular + dysrhythmias Atrial Fibrillation  Rhythm:regular Rate:Normal  TTE (08/30/18): EF 55-60%   Neuro/Psych    GI/Hepatic ,GERD  ,,  Endo/Other    Renal/GU      Musculoskeletal  (+) Arthritis ,    Abdominal   Peds  Hematology   Anesthesia Other Findings   Reproductive/Obstetrics                             Anesthesia Physical Anesthesia Plan  ASA: 3  Anesthesia Plan: General   Post-op Pain Management:    Induction: Intravenous  PONV Risk Score and Plan: 2 and Ondansetron , Dexamethasone  and Treatment may vary due to age or medical condition  Airway Management Planned: Oral ETT  Additional Equipment:   Intra-op Plan:   Post-operative Plan: Extubation in OR  Informed Consent: I have reviewed the patients History and Physical, chart, labs and discussed the procedure including the risks, benefits and alternatives for the proposed anesthesia with the patient or authorized representative who has indicated his/her understanding and acceptance.     Dental advisory given  Plan Discussed with: CRNA, Anesthesiologist and Surgeon  Anesthesia Plan Comments: (PAT note by Lynwood Hope, PA-C: 78 yo male follows with cardiology for hx of afib. Stress and echo 2020 were normal. Last seen by Dr. Lavona 04/18/23 for preop eval. Per note, The patient is at acceptable risk for the planned procedure without further cardiovascular testing necessary according to ACC/AHA guidelines.  He can hold his Eliquis  for 2 days prior to the procedure and resume as directed by the  surgeon.  Preop labs reviewed, mild hyponatremia with sodium 133, mild anemia hgb 12.6, mild thrombocytopenia Plt 130.  EKG 04/18/23: Atrial fibrillation. Rate 81. Right bundle branch block  TTE 08/30/18: 1. The left ventricle has normal systolic function, with an ejection  fraction of 55-60%. The cavity size was normal. Left ventricular diastolic  function could not be evaluated secondary to atrial fibrillation. No  evidence of left ventricular regional  wall motion abnormalities.  2. The right ventricle has normal systolic function. The cavity was  normal. There is no increase in right ventricular wall thickness.  3. Left atrial size was mildly dilated.  4. Right atrial size was mildly dilated.  5. Tricuspid valve regurgitation is mild-moderate.  6. The aortic valve is tricuspid Mild calcification of the aortic valve.  7. The aortic root and ascending aorta are normal in size and structure.   Nuclear stress 08/30/18:  Nuclear stress EF: 61%.  The left ventricular ejection fraction is normal (55-65%).  The study is normal.  This is a low risk study.   Normal pharmacologic nuclear stress test with no evidence for a prior infarct or ischemia. Normal LVEF.   )        Anesthesia Quick Evaluation

## 2023-06-26 ENCOUNTER — Encounter (HOSPITAL_COMMUNITY)
Admission: RE | Disposition: A | Discharge status: Home / Self Care | Payer: Self-pay | Source: Home / Self Care | Attending: General Surgery

## 2023-06-26 ENCOUNTER — Ambulatory Visit (HOSPITAL_COMMUNITY)
Admission: RE | Admit: 2023-06-26 | Discharge: 2023-06-26 | Disposition: A | Discharge status: Home / Self Care | Payer: Medicare Other | Attending: General Surgery | Admitting: General Surgery

## 2023-06-26 ENCOUNTER — Encounter (HOSPITAL_COMMUNITY): Payer: Self-pay | Admitting: General Surgery

## 2023-06-26 ENCOUNTER — Ambulatory Visit (HOSPITAL_COMMUNITY): Payer: Medicare Other | Admitting: Physician Assistant

## 2023-06-26 ENCOUNTER — Ambulatory Visit (HOSPITAL_BASED_OUTPATIENT_CLINIC_OR_DEPARTMENT_OTHER): Payer: Medicare Other | Admitting: Anesthesiology

## 2023-06-26 ENCOUNTER — Other Ambulatory Visit: Payer: Self-pay

## 2023-06-26 DIAGNOSIS — I482 Chronic atrial fibrillation, unspecified: Secondary | ICD-10-CM

## 2023-06-26 DIAGNOSIS — Z7901 Long term (current) use of anticoagulants: Secondary | ICD-10-CM | POA: Insufficient documentation

## 2023-06-26 DIAGNOSIS — I4891 Unspecified atrial fibrillation: Secondary | ICD-10-CM | POA: Diagnosis not present

## 2023-06-26 DIAGNOSIS — K4020 Bilateral inguinal hernia, without obstruction or gangrene, not specified as recurrent: Secondary | ICD-10-CM

## 2023-06-26 DIAGNOSIS — K219 Gastro-esophageal reflux disease without esophagitis: Secondary | ICD-10-CM | POA: Diagnosis not present

## 2023-06-26 HISTORY — PX: INGUINAL HERNIA REPAIR: SHX194

## 2023-06-26 SURGERY — REPAIR, HERNIA, INGUINAL, BILATERAL, LAPAROSCOPIC
Anesthesia: General | Site: Abdomen | Laterality: Bilateral

## 2023-06-26 MED ORDER — BUPIVACAINE HCL (PF) 0.25 % IJ SOLN
INTRAMUSCULAR | Status: AC
  Filled 2023-06-26: qty 30

## 2023-06-26 MED ORDER — CHLORHEXIDINE GLUCONATE 0.12 % MT SOLN
OROMUCOSAL | Status: AC
  Filled 2023-06-26: qty 15

## 2023-06-26 MED ORDER — LIDOCAINE 2% (20 MG/ML) 5 ML SYRINGE
INTRAMUSCULAR | Status: AC
  Filled 2023-06-26: qty 5

## 2023-06-26 MED ORDER — 0.9 % SODIUM CHLORIDE (POUR BTL) OPTIME
TOPICAL | Status: DC | PRN
  Administered 2023-06-26: 1000 mL

## 2023-06-26 MED ORDER — VASOPRESSIN 20 UNIT/ML IV SOLN
INTRAVENOUS | Status: AC
  Filled 2023-06-26: qty 1

## 2023-06-26 MED ORDER — ORAL CARE MOUTH RINSE: 15.0000 mL | Freq: Once | OROMUCOSAL | Status: AC

## 2023-06-26 MED ORDER — PROPOFOL 10 MG/ML IV BOLUS
INTRAVENOUS | Status: DC | PRN
  Administered 2023-06-26: 130 mg via INTRAVENOUS

## 2023-06-26 MED ORDER — ONDANSETRON HCL 4 MG/2ML IJ SOLN
INTRAMUSCULAR | Status: DC | PRN
  Administered 2023-06-26: 4 mg via INTRAVENOUS

## 2023-06-26 MED ORDER — EPHEDRINE SULFATE-NACL 50-0.9 MG/10ML-% IV SOSY
PREFILLED_SYRINGE | INTRAVENOUS | Status: DC | PRN
  Administered 2023-06-26: 5 mg via INTRAVENOUS

## 2023-06-26 MED ORDER — OXYCODONE HCL 5 MG PO TABS: 5.0000 mg | ORAL_TABLET | Freq: Once | ORAL | Status: DC | PRN

## 2023-06-26 MED ORDER — GLYCOPYRROLATE PF 0.2 MG/ML IJ SOSY
PREFILLED_SYRINGE | INTRAMUSCULAR | Status: AC
  Filled 2023-06-26: qty 1

## 2023-06-26 MED ORDER — GLYCOPYRROLATE PF 0.2 MG/ML IJ SOSY
PREFILLED_SYRINGE | INTRAMUSCULAR | Status: DC | PRN
  Administered 2023-06-26: .1 mg via INTRAVENOUS

## 2023-06-26 MED ORDER — SUGAMMADEX SODIUM 200 MG/2ML IV SOLN
INTRAVENOUS | Status: DC | PRN
  Administered 2023-06-26: 152.8 mg via INTRAVENOUS

## 2023-06-26 MED ORDER — OXYCODONE HCL 5 MG/5ML PO SOLN: 5.0000 mg | Freq: Once | ORAL | Status: DC | PRN

## 2023-06-26 MED ORDER — CEFAZOLIN SODIUM-DEXTROSE 2-3 GM-%(50ML) IV SOLR
INTRAVENOUS | Status: DC | PRN
  Administered 2023-06-26: 2 g via INTRAVENOUS

## 2023-06-26 MED ORDER — CEFAZOLIN SODIUM 1 G IJ SOLR
INTRAMUSCULAR | Status: AC
  Filled 2023-06-26: qty 20

## 2023-06-26 MED ORDER — DEXAMETHASONE SODIUM PHOSPHATE 10 MG/ML IJ SOLN
INTRAMUSCULAR | Status: DC | PRN
  Administered 2023-06-26: 5 mg via INTRAVENOUS

## 2023-06-26 MED ORDER — FENTANYL CITRATE (PF) 250 MCG/5ML IJ SOLN
INTRAMUSCULAR | Status: AC
  Filled 2023-06-26: qty 5

## 2023-06-26 MED ORDER — FENTANYL CITRATE (PF) 250 MCG/5ML IJ SOLN
INTRAMUSCULAR | Status: DC | PRN
  Administered 2023-06-26 (×3): 50 ug via INTRAVENOUS

## 2023-06-26 MED ORDER — ONDANSETRON HCL 4 MG/2ML IJ SOLN
INTRAMUSCULAR | Status: AC
  Filled 2023-06-26: qty 2

## 2023-06-26 MED ORDER — CHLORHEXIDINE GLUCONATE 0.12 % MT SOLN
15.0000 mL | Freq: Once | OROMUCOSAL | Status: AC
  Administered 2023-06-26: 15 mL via OROMUCOSAL

## 2023-06-26 MED ORDER — EPHEDRINE 5 MG/ML INJ
INTRAVENOUS | Status: AC
  Filled 2023-06-26: qty 5

## 2023-06-26 MED ORDER — PROPOFOL 10 MG/ML IV BOLUS
INTRAVENOUS | Status: AC
  Filled 2023-06-26: qty 20

## 2023-06-26 MED ORDER — ROCURONIUM BROMIDE 10 MG/ML (PF) SYRINGE
PREFILLED_SYRINGE | INTRAVENOUS | Status: DC | PRN
  Administered 2023-06-26: 50 mg via INTRAVENOUS

## 2023-06-26 MED ORDER — BUPIVACAINE HCL (PF) 0.25 % IJ SOLN
INTRAMUSCULAR | Status: DC | PRN
  Administered 2023-06-26: 5.5 mL

## 2023-06-26 MED ORDER — FENTANYL CITRATE (PF) 100 MCG/2ML IJ SOLN: 25.0000 ug | INTRAMUSCULAR | Status: DC | PRN

## 2023-06-26 MED ORDER — MIDAZOLAM HCL 2 MG/2ML IJ SOLN
INTRAMUSCULAR | Status: AC
  Filled 2023-06-26: qty 2

## 2023-06-26 MED ORDER — ONDANSETRON HCL 4 MG/2ML IJ SOLN: 4.0000 mg | Freq: Four times a day (QID) | INTRAMUSCULAR | Status: DC | PRN

## 2023-06-26 MED ORDER — DEXAMETHASONE SODIUM PHOSPHATE 10 MG/ML IJ SOLN
INTRAMUSCULAR | Status: AC
  Filled 2023-06-26: qty 1

## 2023-06-26 MED ORDER — LIDOCAINE 2% (20 MG/ML) 5 ML SYRINGE
INTRAMUSCULAR | Status: DC | PRN
  Administered 2023-06-26: 60 mg via INTRAVENOUS

## 2023-06-26 MED ORDER — LACTATED RINGERS IV SOLN: INTRAVENOUS | Status: DC

## 2023-06-26 SURGICAL SUPPLY — 40 items
APPLIER CLIP LOGIC TI 5 (MISCELLANEOUS) IMPLANT
BAG COUNTER SPONGE SURGICOUNT (BAG) ×1 IMPLANT
CANISTER SUCT 3000ML PPV (MISCELLANEOUS) IMPLANT
CHLORAPREP W/TINT 26 (MISCELLANEOUS) ×1 IMPLANT
COVER SURGICAL LIGHT HANDLE (MISCELLANEOUS) ×1 IMPLANT
DERMABOND ADVANCED .7 DNX12 (GAUZE/BANDAGES/DRESSINGS) ×1 IMPLANT
DISSECTOR BLUNT TIP ENDO 5MM (MISCELLANEOUS) IMPLANT
DRAPE LAPAROSCOPIC ABDOMINAL (DRAPES) ×1 IMPLANT
ELECT REM PT RETURN 9FT ADLT (ELECTROSURGICAL) ×1
ELECTRODE REM PT RTRN 9FT ADLT (ELECTROSURGICAL) ×1 IMPLANT
GLOVE BIO SURGEON STRL SZ7.5 (GLOVE) ×2 IMPLANT
GOWN STRL REUS W/ TWL LRG LVL3 (GOWN DISPOSABLE) ×2 IMPLANT
GOWN STRL REUS W/ TWL XL LVL3 (GOWN DISPOSABLE) ×1 IMPLANT
IRRIG SUCT STRYKERFLOW 2 WTIP (MISCELLANEOUS)
IRRIGATION SUCT STRKRFLW 2 WTP (MISCELLANEOUS) IMPLANT
KIT BASIN OR (CUSTOM PROCEDURE TRAY) ×1 IMPLANT
KIT TURNOVER KIT B (KITS) ×1 IMPLANT
MESH 3DMAX 4X6 LT LRG (Mesh General) IMPLANT
MESH 3DMAX 4X6 RT LRG (Mesh General) IMPLANT
NDL INSUFFLATION 14GA 120MM (NEEDLE) IMPLANT
NEEDLE INSUFFLATION 14GA 120MM (NEEDLE)
NS IRRIG 1000ML POUR BTL (IV SOLUTION) ×1 IMPLANT
PAD ARMBOARD 7.5X6 YLW CONV (MISCELLANEOUS) ×2 IMPLANT
RELOAD STAPLE 4.0 BLU F/HERNIA (INSTRUMENTS) IMPLANT
RELOAD STAPLE 4.8 BLK F/HERNIA (STAPLE) IMPLANT
RELOAD STAPLE HERNIA 4.0 BLUE (INSTRUMENTS) ×1
RELOAD STAPLE HERNIA 4.8 BLK (STAPLE)
SCISSORS LAP 5X35 DISP (ENDOMECHANICALS) ×1 IMPLANT
SET TUBE SMOKE EVAC HIGH FLOW (TUBING) ×1 IMPLANT
STAPLER HERNIA 12 8.5 360D (INSTRUMENTS) IMPLANT
SUT MNCRL AB 4-0 PS2 18 (SUTURE) ×1 IMPLANT
SUT VIC AB 1 CT1 27XBRD ANBCTR (SUTURE) IMPLANT
TOWEL GREEN STERILE (TOWEL DISPOSABLE) ×1 IMPLANT
TOWEL GREEN STERILE FF (TOWEL DISPOSABLE) ×1 IMPLANT
TRAY LAPAROSCOPIC MC (CUSTOM PROCEDURE TRAY) ×1 IMPLANT
TROCAR OPTICAL SHORT 5MM (TROCAR) ×1 IMPLANT
TROCAR OPTICAL SLV SHORT 5MM (TROCAR) ×1 IMPLANT
TROCAR Z THREAD OPTICAL 12X100 (TROCAR) ×1 IMPLANT
WARMER LAPAROSCOPE (MISCELLANEOUS) ×1 IMPLANT
WATER STERILE IRR 1000ML POUR (IV SOLUTION) ×1 IMPLANT

## 2023-06-26 NOTE — Transfer of Care (Signed)
 Immediate Anesthesia Transfer of Care Note  Patient: Andre Parker.  Procedure(s) Performed: LAPAROSCOPIC BILATERAL INGUINAL HERNIA REPAIR WITH MESH (Bilateral: Abdomen)  Patient Location: PACU  Anesthesia Type:General  Level of Consciousness: drowsy and responds to stimulation  Airway & Oxygen Therapy: Patient Spontanous Breathing and Patient connected to face mask oxygen  Post-op Assessment: Report given to RN, Post -op Vital signs reviewed and stable, and Patient moving all extremities  Post vital signs: Reviewed and stable  Last Vitals:  Vitals Value Taken Time  BP 150/83 06/26/23 0825  Temp    Pulse 90 06/26/23 0827  Resp 18 06/26/23 0827  SpO2 99 % 06/26/23 0827  Vitals shown include unfiled device data.  Last Pain:  Vitals:   06/26/23 0641  TempSrc:   PainSc: 0-No pain      Patients Stated Pain Goal: 0 (06/26/23 0641)  Complications: No notable events documented.

## 2023-06-26 NOTE — Anesthesia Procedure Notes (Signed)
 Procedure Name: Intubation Date/Time: 06/26/2023 7:34 AM  Performed by: Lamar Lucie DASEN, CRNAPre-anesthesia Checklist: Patient identified, Emergency Drugs available, Suction available and Patient being monitored Patient Re-evaluated:Patient Re-evaluated prior to induction Oxygen Delivery Method: Circle system utilized Preoxygenation: Pre-oxygenation with 100% oxygen Induction Type: IV induction Ventilation: Mask ventilation without difficulty Laryngoscope Size: Mac and 4 Grade View: Grade I Tube type: Oral Number of attempts: 1 Airway Equipment and Method: Stylet and Oral airway Placement Confirmation: ETT inserted through vocal cords under direct vision, positive ETCO2 and breath sounds checked- equal and bilateral Secured at: 23 cm Tube secured with: Tape Dental Injury: Teeth and Oropharynx as per pre-operative assessment

## 2023-06-26 NOTE — H&P (Signed)
Chief Complaint: New Patient (Right ing hernia )       History of Present Illness: Andre Parker is a 78 y.o. male who is seen today as an office consultation at the request of Dr. Linwood Dibbles for evaluation of New Patient (Right ing hernia ) .   Patient is a 78 year old male who comes in with a history of A-fib-on Eliquis, with a right inguinal hernia.  He states he is notices 2 to 3 weeks ago.  He states he was pushing a lawn more and thereafter noticed a bulge in the right inguinal area.  He states that he has been using a hernia truss since that time.  He had no significant pain or discomfort.   Patient states he had no signs or symptoms of incarceration or strangulation. 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         Review of Systems: A complete review of systems was obtained from the patient.  I have reviewed this information and discussed as appropriate with the patient.  See HPI as well for other ROS.   Review of Systems  Constitutional:  Negative for fever.  HENT:  Negative for congestion.   Eyes:  Negative for blurred vision.  Respiratory:  Negative for cough, shortness of breath and wheezing.   Cardiovascular:  Negative for chest pain and palpitations.  Gastrointestinal:  Negative for heartburn.  Genitourinary:  Negative for dysuria.  Musculoskeletal:  Negative for myalgias.  Skin:  Negative for rash.  Neurological:  Negative for dizziness and headaches.  Psychiatric/Behavioral:  Negative for depression and suicidal ideas.   All other systems reviewed and are negative.       Medical History: Past Medical History History reviewed. No pertinent past medical history.     Problem List There is no problem list on file for this patient.     Past Surgical History History reviewed. No pertinent surgical history.     Allergies No Known Allergies    Medications Ordered Prior to Encounter Current Outpatient Medications on File Prior to  Visit Medication Sig Dispense Refill  calcium citrate (CALCITRATE) 200 mg (950 mg) tablet Take by mouth      cyclobenzaprine (FLEXERIL) 5 MG tablet Take 1 tablet by mouth at bedtime as needed      ELIQUIS 5 mg tablet Take 1 tablet by mouth 2 (two) times daily      ferrous sulfate 325 (65 FE) MG EC tablet Take 325 mg by mouth daily with breakfast      magnesium 250 mg Tab Take by mouth      DOCOSAHEXAENOIC ACID ORAL Take 1 g by mouth once daily (Patient not taking: Reported on 03/14/2023)        No current facility-administered medications on file prior to visit.      Family History History reviewed. No pertinent family history.     Tobacco Use History Social History    Tobacco Use Smoking Status Never Smokeless Tobacco Never      Social History Social History    Socioeconomic History  Marital status: Married Tobacco Use  Smoking status: Never  Smokeless tobacco: Never Substance and Sexual Activity  Alcohol use: Never  Drug use: Never    Social Determinants of Health    Food Insecurity: No Food Insecurity (07/19/2022)   [facility]   Hunger Vital Sign    Worried About Running Out of Food in the Last Year: Never true    Ran Out of Food in the Last  Year: Never true Transportation Needs: No Transportation Needs (07/19/2022)   Received from [facility] - Transportation    Lack of Transportation (Medical): No    Lack of Transportation (Non-Medical): No      Objective:     Vitals:   03/14/23 1427 BP: 118/80 Pulse: 88 Temp: 36.4 C (97.6 F) SpO2: 98% Weight: 75.4 kg (166 lb 3.2 oz) Height: 179.1 cm (5' 10.5")   Body mass index is 23.51 kg/m. Physical Exam Constitutional:      Appearance: Normal appearance.  HENT:     Head: Normocephalic and atraumatic.     Nose: Nose normal. No congestion.     Mouth/Throat:     Mouth: Mucous membranes are moist.     Pharynx: Oropharynx is clear.  Eyes:     Pupils: Pupils are equal, round,  and reactive to light.  Cardiovascular:     Rate and Rhythm: Normal rate and regular rhythm.     Pulses: Normal pulses.     Heart sounds: Normal heart sounds. No murmur heard.    No friction rub. No gallop.  Pulmonary:     Effort: Pulmonary effort is normal. No respiratory distress.     Breath sounds: Normal breath sounds. No stridor. No wheezing, rhonchi or rales.  Abdominal:     General: Abdomen is flat.     Hernia: A hernia is present. Hernia is present in the left inguinal area and right inguinal area.  Musculoskeletal:        General: Normal range of motion.     Cervical back: Normal range of motion.  Skin:    General: Skin is warm and dry.  Neurological:     General: No focal deficit present.     Mental Status: He is alert and oriented to person, place, and time.  Psychiatric:        Mood and Affect: Mood normal.        Thought Content: Thought content normal.          Assessment and Plan: Diagnoses and all orders for this visit:   Unilateral inguinal hernia without obstruction or gangrene, recurrence not specified   Bilateral inguinal hernia without obstruction or gangrene, recurrence not specified     Andre Parker is a 78 y.o. male    1.  We will proceed to the OR for a laparoscopic bilateral inguinal hernia repair with mesh. 2. All risks and benefits were discussed with the patient, to generally include infection, bleeding, damage to surrounding structures, acute and chronic nerve pain, and recurrence. Alternatives were offered and described.  All questions were answered and the patient voiced understanding of the procedure and wishes to proceed at this point.             No follow-ups on file.   Axel Filler, MD, [facility], [place] [facility]

## 2023-06-26 NOTE — Discharge Instructions (Signed)

## 2023-06-26 NOTE — Op Note (Signed)
 06/26/2023  8:18 AM  PATIENT:  Andre Parker.  78 y.o. male  PRE-OPERATIVE DIAGNOSIS:  BILATERAL INGUINAL HERNIA  POST-OPERATIVE DIAGNOSIS:  BILATERAL DIRECT  INGUINAL HERNIA  PROCEDURE:  Procedure(s): LAPAROSCOPIC BILATERAL INGUINAL HERNIA REPAIR WITH MESH (Bilateral)  SURGEON:  Surgeons and Role:    DEWAINE Rubin Calamity, MD - Primary ASSISTANTS: none   ANESTHESIA:   local and general  EBL:  minimal   BLOOD ADMINISTERED:none  DRAINS: none   LOCAL MEDICATIONS USED:  BUPIVICAINE   SPECIMEN:  No Specimen  DISPOSITION OF SPECIMEN:  N/A  COUNTS:  YES  TOURNIQUET:  * No tourniquets in log *  DICTATION: .Dragon Dictation   Counts: reported as correct x 2  Findings:  The patient had a medium sized right & left direct hernias  Indications for procedure:  The patient is a 78 year old male with bilatearl hernias for several months. Patient complained of symptomatology to his bilateral inguinal areas. The patient was taken back for elective inguinal hernia repair.  Details of the procedure:  The patient was taken back to the operating room. The patient was placed in supine position with bilateral SCDs in place.  The patient underwent GETA.  A foley catheter was placed. The patient was prepped and draped in the usual sterile fashion.  After appropriate anitbiotics were confirmed, a time-out was confirmed and all facts were verified.  0.25% Marcaine  was used to infiltrate the umbilical area. A 11-blade was used to cut down the skin and blunt dissection was used to get the anterior fashion.  The anterior fascia was incised approximately 1 cm and the muscles were retracted laterally. Blunt dissection was then used to create a space in the preperitoneal area. At this time a 10 mm camera was then introduced into the space and advanced the pubic tubercle and a 12 mm trocar was placed over this and insufflation was started.  At this time and space was created from medial to laterally the  preperitoneal space.  Cooper's ligament was initially cleaned off.  There appeared to be the hernia sac at the direct space.  This was dissected away from the preperitoneal fat.  The transversalis fascia retracted spontaneously.  The spermatic cord was identified and dissected away from the cremesteric muscle fibers.  There was no indirect hernia.. Dissection of the peritoneum was undertaken the vas deferens was identified and protected in all parts of the case.   Once the hernia sac was taken down to approximately the umbilicus a Bard 3D Max mesh, size: Large, was  introduced into the preperitoneal space.  The mesh was brought over to cover the direct and indirect hernia spaces.  This was anchored into place and secured to Cooper's ligament with 4.24mm staples from a Coviden hernia stapler. It was anchored to the anterior abdominal wall with 4.8 mm staples. The hernia sac was seen lying posterior to the mesh. There was no staples placed laterally.   The exact same dissection took place on the opposite side.  There was a left-sided direct hernia as well.  This was moderate in size.  This was dissected away from the preperitoneal fat.  The transversalis fascia retracted spontaneously.  The spermatic cord was identified.  The peritoneum was identified and dissected away from the cremesteric muscle fibers.   Dissection of the peritoneum was undertaken the vas deferens was identified and protected in all parts of the case.  A 3D Max mesh, large size was placed in the preperitoneal space.  This cover both  the direct, indirect spaces appropriately.  The insufflation was evacuated and the peritoneum was seen posterior to the mesh bilaterally. The trochars were removed. The anterior fascia was reapproximated using #1 Vicryl on a UR- 6.  Intra-abdominal air was evacuated and the Veress needle removed. The skin was reapproximated using 4-0 Monocryl subcuticular fashion and was dressed with Dermabond. The  patient was  awakened from general anesthesia and taken to recovery in stable condition.    PLAN OF CARE: Discharge to home after PACU  PATIENT DISPOSITION:  PACU - hemodynamically stable.   Delay start of Pharmacological VTE agent (>24hrs) due to surgical blood loss or risk of bleeding: not applicable

## 2023-06-26 NOTE — Anesthesia Postprocedure Evaluation (Signed)
 Anesthesia Post Note  Patient: Andre Parker.  Procedure(s) Performed: LAPAROSCOPIC BILATERAL INGUINAL HERNIA REPAIR WITH MESH (Bilateral: Abdomen)     Patient location during evaluation: PACU Anesthesia Type: General Level of consciousness: awake and alert Pain management: pain level controlled Vital Signs Assessment: post-procedure vital signs reviewed and stable Respiratory status: spontaneous breathing, nonlabored ventilation, respiratory function stable and patient connected to nasal cannula oxygen Cardiovascular status: blood pressure returned to baseline and stable Postop Assessment: no apparent nausea or vomiting Anesthetic complications: no   No notable events documented.  Last Vitals:  Vitals:   06/26/23 0830 06/26/23 0845  BP: 107/89 (!) 146/85  Pulse: 89 87  Resp: (!) 22 14  Temp:  36.7 C  SpO2: 99% 97%    Last Pain:  Vitals:   06/26/23 0845  TempSrc:   PainSc: 0-No pain                 Andre Parker S

## 2023-06-27 ENCOUNTER — Encounter (HOSPITAL_COMMUNITY): Payer: Self-pay | Admitting: General Surgery

## 2023-07-06 ENCOUNTER — Other Ambulatory Visit: Payer: Self-pay | Admitting: Medical Genetics

## 2023-07-31 ENCOUNTER — Other Ambulatory Visit (HOSPITAL_COMMUNITY)
Admission: RE | Admit: 2023-07-31 | Discharge: 2023-07-31 | Disposition: A | Discharge status: Home / Self Care | Payer: Self-pay | Source: Ambulatory Visit | Attending: Medical Genetics | Admitting: Medical Genetics

## 2023-08-09 LAB — GENECONNECT MOLECULAR SCREEN: Genetic Analysis Overall Interpretation: NEGATIVE

## 2023-08-14 ENCOUNTER — Other Ambulatory Visit: Payer: Self-pay | Admitting: Cardiology

## 2023-08-14 DIAGNOSIS — I4819 Other persistent atrial fibrillation: Secondary | ICD-10-CM

## 2023-08-14 NOTE — Telephone Encounter (Signed)
 Eliquis 5mg  refill request received. Patient is 78 years old, weight-76.4kg, Crea- 0.81 on 05/29/23 via [facility] from [facility], Diagnosis-Afib, and last seen by Dr. Antoine Poche on 04/18/23. Dose is appropriate based on dosing criteria. Will send in refill to requested pharmacy.

## 2023-12-09 ENCOUNTER — Encounter (HOSPITAL_COMMUNITY): Payer: Self-pay | Admitting: Interventional Radiology

## 2024-03-03 ENCOUNTER — Other Ambulatory Visit: Payer: Self-pay | Admitting: Cardiology

## 2024-03-03 DIAGNOSIS — I4819 Other persistent atrial fibrillation: Secondary | ICD-10-CM

## 2024-03-04 NOTE — Telephone Encounter (Signed)
 Prescription refill request for Eliquis  received. Indication: AF Last office visit: 04/18/23   Scr: 0.81 on 05/29/23  Epic Age: 78 Weight: 75.8kg  Based on above findings Eliquis  5mg  twice daily is the appropriate dose.  Refill approved.

## 2024-06-09 ENCOUNTER — Telehealth: Payer: Self-pay | Admitting: Cardiology

## 2024-06-09 DIAGNOSIS — I4819 Other persistent atrial fibrillation: Secondary | ICD-10-CM

## 2024-06-10 NOTE — Telephone Encounter (Signed)
 Prescription refill request for Eliquis  received. Indication:afib Last office visit:needs appt Scr: Age:  Weight:  Prescription refilled

## 2024-06-12 NOTE — Telephone Encounter (Signed)
" °*  STAT* If patient is at the pharmacy, call can be transferred to refill team.   1. Which medications need to be refilled? (please list name of each medication and dose if known) apixaban  (ELIQUIS ) 5 MG TABS tablet    2. Would you like to learn more about the convenience, safety, & potential cost savings by using the [facility]? No   3. Are you open to using the [facility] (Type [facility]. No    4. Which pharmacy/location (including street and city if local pharmacy) is medication to be sent to? [facility] [address], [place] - [address]     5. Do they need a 30 day or 90 day supply? 90 day   Pt has appt set 08/09/24.   "

## 2024-06-17 NOTE — Telephone Encounter (Signed)
 Refill sent on 06/10/24.

## 2024-07-08 NOTE — Progress Notes (Unsigned)
" °  Cardiology Office Note:   Date:  07/09/2024  ID:  Andre Parker., DOB 10-27-1945, MRN [id number] PCP: [name]  [name] Cardiologist:  Lynwood Schilling, MD {  History of Present Illness:   Andre Parker. is a 79 y.o. male  who presents for evaluation of atrial fib.  He has had a Holter with persistent atrial fib with good rate control.  His echo was normal.   Stress perfusion study was normal in 2020.     Since I last saw him he had a sister who developed atrial fib and a brother who needed a pacemaker.  He himself has done well.  The patient denies any new symptoms such as chest discomfort, neck or arm discomfort. There has been no new shortness of breath, PND or orthopnea. There have been no reported palpitations, presyncope or syncope.   He walks the dogs routinely.    ROS: As stated in the HPI and negative for all other systems.  Studies Reviewed:    EKG:   EKG Interpretation Date/Time:  Tuesday July 09 2024 14:26:21 EST Ventricular Rate:  74 PR Interval:    QRS Duration:  144 QT Interval:  388 QTC Calculation: 430 R Axis:   84  Text Interpretation: Atrial fibrillation Right bundle branch block When compared with ECG of 18-Apr-2023 16:07, No significant change was found Confirmed by Schilling Rattan ([id number]) on 07/09/2024 2:29:38 PM     Risk Assessment/Calculations:    CHA2DS2-VASc Score = 3   This indicates a 3.2% annual risk of stroke. The patient's score is based upon: CHF History: 0 HTN History: 1 Diabetes History: 0 Stroke History: 0 Vascular Disease History: 0 Age Score: 2 Gender Score: 0   Physical Exam:   VS:  BP 96/70 (BP Location: Left Arm, Patient Position: Sitting, Cuff Size: Normal)   Pulse 74   Ht 5' 10.5 (1.791 m)   Wt 171 lb 11.2 oz (77.9 kg)   SpO2 94%   BMI 24.29 kg/m    Wt Readings from Last 3 Encounters:  07/09/24 171 lb 11.2 oz (77.9 kg)  06/26/23 168 lb 8 oz (76.4 kg)  06/20/23 168 lb 8 oz (76.4  kg)     GEN: Well nourished, well developed in no acute distress NECK: No JVD; No carotid bruits CARDIAC: Irregular RR, no murmurs, rubs, gallops RESPIRATORY:  Clear to auscultation without rales, wheezing or rhonchi  ABDOMEN: Soft, non-tender, non-distended EXTREMITIES:  No edema; No deformity   ASSESSMENT AND PLAN:   ATRIAL FIB:  Andre Parker has a CHA2DS2 - VASc score of  3.  He does not notice this and has good rate control.  He tolerates anticoagulation and was able to follow-up labs from his primary provider.  No change in therapy is indicated.   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  We talked about symptoms that could necessitate further management such as presyncope or syncope.  He has had none of this and feels well.  No change in therapy.     Follow up with me in one year.   Signed, Lynwood Schilling, MD   "

## 2024-07-09 ENCOUNTER — Ambulatory Visit: Admitting: Cardiology

## 2024-07-09 ENCOUNTER — Encounter: Payer: Self-pay | Admitting: Cardiology

## 2024-07-09 VITALS — BP 96/70 | HR 74 | Ht 70.5 in | Wt 171.7 lb

## 2024-07-09 DIAGNOSIS — I4821 Permanent atrial fibrillation: Secondary | ICD-10-CM

## 2024-07-09 NOTE — Patient Instructions (Signed)
 Medication Instructions:  Your physician recommends that you continue on your current medications as directed. Please refer to the Current Medication list given to you today.  *If you need a refill on your cardiac medications before your next appointment, please call your pharmacy*  Lab Work: NONE If you have labs (blood work) drawn today and your tests are completely normal, you will receive your results only by: MyChart Message (if you have MyChart) OR A paper copy in the mail If you have any lab test that is abnormal or we need to change your treatment, we will call you to review the results.  Testing/Procedures: NONE  Follow-Up: At [facility], you and your health needs are our priority.  As part of our continuing mission to provide you with exceptional heart care, our providers are all part of one team.  This team includes your primary Cardiologist (physician) and Advanced Practice Providers or APPs (Physician Assistants and Nurse Practitioners) who all work together to provide you with the care you need, when you need it.  Your next appointment:   1 year(s)  Provider:   Eilleen Grates, MD    We recommend signing up for the patient portal called "MyChart".  Sign up information is provided on this After Visit Summary.  MyChart is used to connect with patients for Virtual Visits (Telemedicine).  Patients are able to view lab/test results, encounter notes, upcoming appointments, etc.  Non-urgent messages can be sent to your provider as well.   To learn more about what you can do with MyChart, go to [email or web address].   Other Instructions
